# Patient Record
Sex: Female | Born: 1980 | Hispanic: Yes | Marital: Married | Smoking: Never smoker
Health system: Southern US, Community
[De-identification: ages and names within clinical notes are randomized; demographics above are authoritative.]

## PROBLEM LIST (undated history)

## (undated) DIAGNOSIS — Z789 Other specified health status: Secondary | ICD-10-CM

## (undated) HISTORY — PX: NO PAST SURGERIES: SHX2092

---

## 2012-05-24 NOTE — L&D Delivery Note (Signed)
Delivery Note At 4:49 AM a viable female was delivered via Vaginal, Spontaneous Delivery (Presentation: ; Occiput Anterior).  APGAR: 9, 9; weight pending.   Placenta status: Intact, Spontaneous.  Cord: 3-vessel, with the following complications: None.  Cord pH: n/a  Anesthesia: Local  Episiotomy: None Lacerations: 1st degree perineal Suture Repair: 3.0 vicryl rapide Est. Blood Loss (mL): 300  Mom to postpartum.  Baby to nursery-stable.  Napoleon Form 11/20/2012, 5:12 AM

## 2012-06-06 LAB — OB RESULTS CONSOLE ANTIBODY SCREEN: Antibody Screen: NEGATIVE

## 2012-06-06 LAB — OB RESULTS CONSOLE HEPATITIS B SURFACE ANTIGEN: Hepatitis B Surface Ag: NEGATIVE

## 2012-06-06 LAB — OB RESULTS CONSOLE VARICELLA ZOSTER ANTIBODY, IGG: Varicella: IMMUNE

## 2012-06-06 LAB — OB RESULTS CONSOLE GC/CHLAMYDIA: Chlamydia: NEGATIVE

## 2012-06-22 LAB — GLUCOSE, 1 HOUR: Glucose, 1 hour: 100

## 2012-09-05 NOTE — Progress Notes (Signed)
+   PPD test 15mm 07/15/12 Quad screen negative 06/26/12

## 2012-09-06 ENCOUNTER — Encounter: Payer: Self-pay | Admitting: *Deleted

## 2012-10-10 ENCOUNTER — Encounter: Payer: Self-pay | Admitting: Obstetrics & Gynecology

## 2012-10-10 ENCOUNTER — Ambulatory Visit (INDEPENDENT_AMBULATORY_CARE_PROVIDER_SITE_OTHER): Payer: Medicaid Other | Admitting: Obstetrics & Gynecology

## 2012-10-10 VITALS — BP 116/71 | Temp 98.3°F | Wt 176.2 lb

## 2012-10-10 DIAGNOSIS — O0933 Supervision of pregnancy with insufficient antenatal care, third trimester: Secondary | ICD-10-CM

## 2012-10-10 DIAGNOSIS — O0930 Supervision of pregnancy with insufficient antenatal care, unspecified trimester: Secondary | ICD-10-CM | POA: Insufficient documentation

## 2012-10-10 LAB — POCT URINALYSIS DIP (DEVICE)
Bilirubin Urine: NEGATIVE
Leukocytes, UA: NEGATIVE

## 2012-10-10 LAB — CBC
Platelets: 257 10*3/uL (ref 150–400)
RBC: 3.54 MIL/uL — ABNORMAL LOW (ref 3.87–5.11)
WBC: 8.7 10*3/uL (ref 4.0–10.5)

## 2012-10-10 NOTE — Progress Notes (Signed)
Subjective:    Tiffany Morse is being seen today for her first obstetrical visit.  This is not a planned pregnancy. She is at [redacted]w[redacted]d gestation. Her obstetrical history is significant for late transfer of care. Relationship with FOB: unknown. Patient does intend to breast feed. Pregnancy history fully reviewed.  Menstrual History: OB History   Grav Para Term Preterm Abortions TAB SAB Ect Mult Living   3 2 2       2        No LMP recorded. Patient is pregnant.    The following portions of the patient's history were reviewed and updated as appropriate: allergies, current medications, past family history, past medical history, past social history, past surgical history and problem list. History   Social History  . Marital Status: Single    Spouse Name: N/A    Number of Children: N/A  . Years of Education: N/A   Occupational History  . Not on file.   Social History Main Topics  . Smoking status: Never Smoker   . Smokeless tobacco: Not on file  . Alcohol Use: No  . Drug Use: No  . Sexually Active: Not Currently   Other Topics Concern  . Not on file   Social History Narrative  . No narrative on file    Review of Systems Pertinent items are noted in HPI.    Objective:    BP 116/71  Temp(Src) 98.3 F (36.8 C)  Wt 176 lb 3.2 oz (79.924 kg)  BMI 31.22 kg/m2  General Appearance:    Alert, cooperative, no distress, appears stated age  Head:    Normocephalic, without obvious abnormality, atraumatic           Throat:   Lips, mucosa, and tongue normal; teeth and gums normal  Neck:   Supple, symmetrical, trachea midline, no adenopathy;    thyroid:  no enlargement/tenderness/nodules; no carotid   bruit or JVD  Back:     Symmetric, no curvature, ROM normal, no CVA tenderness  Lungs:     Clear to auscultation bilaterally, respirations unlabored  Chest Wall:    No tenderness or deformity   Heart:    Regular rate and rhythm, S1 and S2 normal, no murmur, rub   or gallop    Abdomen:     Soft, non-tender, bowel sounds active all four quadrants,    no masses, no organomegaly        Extremities:   Extremities normal, atraumatic, no cyanosis or edema  Pulses:   2+ and symmetric all extremities  Skin:   Skin color, texture, turgor normal, no rashes or lesions  Lymph nodes:   Cervical, supraclavicular, and axillary nodes normal    Assessment:    Pregnancy at 33 and 0/7 weeks  Late transfer of care (records and results under media)- prev OB chart reviewed Did not have 28 week labs   Plan:     Prenatal vitamins. Problem list reviewed and updated. Follow up in 2 weeks. Needs to have Title XIX forms signed on next visit 1hour GTT, RPR, HIV and HCT today

## 2012-10-10 NOTE — Patient Instructions (Addendum)
Prueba de tolerancia a la glucosa durante el embarazo  (Glucose Tolerance Test During Pregnancy) La prueba de tolerancia a la glucosa (GTT) o la prueba de la glucosa de 3 horas se pueden utilizar para determinar si la diabetes en una mujer ha comenzado o se ha diagnosticado por primera vez durante el embarazo (diabetes gestacional ). En general, la GTT se realiza despus de tener una prueba de glucosa de 1hora con resultados que indican que posiblemente sufra diabetes gestacional.  Esta prueba dura aproximadamente 3 horas. Despus de beber la solucin de agua azucarada le harn Neomia Dear serie de anlisis de [place]. Deber Geneticist, molecular de la prueba para asegurarse de que se realiza a Acupuncturist de [place].  INFORME A SU MDICO SOBRE:   Alergias a alimentos o medicamentos.  Medicamentos que [place], incluyendo vitaminas, hierbas, gotas oftlmicas, medicamentos de venta libre y cremas.  Enfermedades o infecciones recientes. ANTES DEL PROCEDIMIENTO  El GTT es [place] prueba de [place], lo que significa que debe dejar de comer durante un cierto perodo de [place]. La prueba ser ms precisa si no come durante 8 a 12 horas antes del examen. Por esta razn, se recomienda hacer esta prueba por la maana antes de desayunar.  PROCEDIMIENTO  No coma ni beba nada, slo agua, durante la prueba. Al llegar al laboratorio, se toma una muestra de sangre para obtener el nivel de glucosa en sangre en [place]. Una vez determinado el nivel de glucosa en [place], se le dar una solucin de azcar en agua para beber. Se le pedir que espere en un rea determinada hasta el prximo examen de sangre. Los anlisis de sangre se realizan cada hora durante 3 horas. Mantngase cerca del laboratorio para que sus muestras de [place] se puedan tomar a Chief Strategy Officer. Esto es importante. Si no se toman las [address] de [place] a Chief Strategy Officer, tendr que concurrir de [employer] en otro da para Automotive engineer prueba.  DESPUS DEL PROCEDIMIENTO   Podr comer  y beber normalmente.   Consulte la fecha en que los resultados estarn disponibles. Asegrese de [employer] Corporation. Se considera que la prueba es positiva [address] de los cuatro valores del [place] de sangre son iguales o estn por arriba del nivel normal de Event organiser. Document Released: 11/09/2011 [facility] Patient Information 2013 [place], [place]. Lactancia materna  (Breastfeeding) Decidir Museum/gallery exhibitions officer es una de las mejores elecciones que puede hacer por usted y su beb. La informacin que se brinda a Psychologist, clinical dar una breve visin de los beneficios de la lactancia materna as como de las dudas ms frecuentes alrededor de ella.  LOS BENEFICIOS DE AMAMANTAR  Para el beb   La primera leche (calostro ) ayuda al mejor funcionamiento del sistema digestivo del beb.   La leche tiene anticuerpos que provienen de la madre y que ayudan a prevenir las infecciones en el beb.   El beb tiene una menor incidencia de asma, alergias y del sndrome de muerte sbita del lactante (SMSL).   Los nutrientes en la [place] materna son mejores para el beb que los preparados para lactantes y la [place] materna ayuda a un mejor desarrollo del cerebro del beb.   Los bebs amamantados tienen menos gases, clicos y estreimiento.  Para la mam   La lactancia materna favorece el desarrollo de un vnculo muy especial entre la madre y el beb.   Es ms conveniente, siempre disponible y a Landscape architect y econmico.   Consume caloras en la [place]  y la ayuda a perder el peso ganado durante el [place].   Favorece la contraccin del tero a su tamao normal, de manera ms rpida y [employer] las hemorragias luego del [place].   Las [employer] Holdings que amamantan tienen menor riesgo de Geophysical data processor de mama.  FRECUENCIA DEL AMAMANTAMIENTO   Un beb sano, nacido a trmino, puede amamantarse con tanta frecuencia como cada hora, o espaciar las comidas cada tres horas.   Observe al beb  cuando manifieste signos de hambre. Amamante a su beb si muestra signos de hambre. Esta frecuencia variar de un beb a otro.   Amamntelo tan seguido como el beb lo solicite, o cuando usted sienta la necesidad de Paramedic sus [place].   Despierte al beb si han pasado 3  4 horas desde la ltima comida.   El amamantamiento frecuente la ayudar a producir ms [place] y a Education officer, community de Engineer, mining en los pezones e hinchazn de las [place].  POSICIN DEL BEBE PARA EL AMAMANTAMIENTO   Ya sea que se encuentre [place] o sentada, asegrese que el abdomen del beb enfrente el suyo.   Sostenga la mama con el pulgar por arriba y los otros 4 dedos por debajo. Asegrese que sus dedos se encuentren lejos del pezn y de la boca del beb.   Empuje suavemente los labios del beb con el pezn o con el dedo.   Cuando la boca del beb se abra lo suficiente, introduzca el pezn y la areola tanto como le sea posible dentro de la boca.   Coloque al beb cerca suyo de modo que su nariz y mejillas toquen las mamas al [employer].  ALIMENTACIN Y SUCCIN   La duracin de cada comida vara de un beb a otro y de Neomia Dear comida a [place].   El beb debe succionar entre 2 y 3 minutos para que Development worker, international aid. Esto se denomina "bajada". Por este motivo, permita que el nio se alimente en cada mama todo lo que desee. Terminar de mamar cuando haya recibido la cantidad [place] de nutrientes.   Para detener la succin coloque su dedo en la comisura de la boca del nio y Midwife entre sus encas antes de quitarle la mama de la boca. Esto la ayudar a English as a second language teacher.  COMO SABER SI EL BEB OBTIENE LA SUFICIENTE LECHE MATERNA  Preguntarse si el beb obtiene la cantidad suficiente de [place] es una preocupacin frecuente [employer]. Puede asegurarse que el beb tiene la leche suficiente si:   El beb succiona activamente y usted escucha que traga .   El beb parece estar relajado y satisfecho despus  de Psychologist, clinical.   El nio se alimenta al menos 8 a 12 veces en 24 horas. Alimntelo hasta que se desprenda por sus propios medios o se quede dormido en la primera mama (al menos durante 10 a 20 minutos), luego ofrzcale el otro lado.   El beb moja 5 a 6 paales desechables (6 a 8 paales de tela) en 24 horas cuando tiene 5  6 das de vida.   Tiene al Lowe's Companies 3 a 4 deposiciones todos los B[employer]on[employer] and Company primeros meses. La materia fecal debe ser blanda y [place].   El beb debe aumentar 4 a 6 libras (120 a 170 gr.) por semana despus de los 4 [address] de vida.   Siente que las mamas se ablandan despus de amamantar  REDUCIR LA CONGESTIN DE LAS MAMAS   Durante la primera semana despus del [place],  usted puede experimentar hinchazn en las mamas. Cuando las mamas estn congestionadas, se sienten calientes, llenas y molestas al tacto. Puede reducir la congestin si:   Lo amamanta frecuentemente, cada 2-3 horas. Las mams que [employer] pronto y con frecuencia tienen menos problemas de [place].   Coloque compresas de hielo en sus mamas durante 10-20 minutos entre cada amamantamiento. Esto ayuda a Building services engineer. Envuelva las bolsas de hielo en una toalla liviana para proteger su piel. Las bolsas de vegetales congelados funcionan bien para este propsito.   Tome una ducha tibia o aplique compresas hmedas calientes en las mamas durante 5 a 10 minutos antes de cada vez que [place]. Esto aumenta la circulacin y [place] a que la [place].   Masajee suavemente la mama antes y Psychologist, sport and exercise. Con las puntas de los dedos, masajee desde la pared torcica hacia abajo hasta llegar al pezn, con movimientos circulares.   Asegrese que el nio vaca al menos una mama antes de cambiar de lado.   Use un sacaleche para vaciar la mama si el beb se duerme o no se alimenta bien. Tambin podr Phelps Dodge con esa bomba si tiene que volver al trabajo o siente que las mamas estn  congestionadas.   Evite los biberones, chupetes o complementar la alimentacin con agua o jugos en lugar de la [place]. La leche materna es todo el alimento que el beb necesita. No es necesario que el nio ingiera agua o preparados de bibern. Louann Liv, es lo mejor para ayudar a que las mamas produzcan ms [place]. no darle suplementos al Bank of America las primeras semanas.   Verifique que el beb se encuentra en la posicin correcta mientras lo alimenta.   Use un sostn que soporte bien sus mamas y State Street Corporation que tienen aro.   Consuma una dieta balanceada y beba lquidos en cantidad.   Descanse con frecuencia, reljese y tome sus vitaminas prenatales para evitar la fatiga, el estrs y la anemia.  Si sigue estas indicaciones, la congestin debe mejorar en 24 a 48 horas. Si an tiene dificultades, consulte a Barista.  CUDESE USTED MISMA  Cuide sus mamas.   Bese o dchese diariamente.   Evite usar Eaton Corporation.   Comience a amamantar del lado izquierdo en una comida y del lado derecho en la siguiente.   Notar que aumenta el flujo de [place] a los 2 a 5 [address] despus del [address]. Puede sentir algunas molestias por la congestin, lo que hace que sus mamas estn duras y sensibles. La congestin disminuye en 24 a 48 horas. Mientras tanto, aplique toallas hmedas calientes durante 5 a 10 minutos antes de amamantar. Un masaje suave y la extraccin de un poco de leche antes de Museum/gallery exhibitions officer ablandarn las mamas y har ms fcil que el beb se agarre.   Use un buen sostn y seque al aire los pezones durante 3 a 4 minutos luego de Wellsite geologist.   Solo utilice apsitos de algodn.   Utilice lanolina [employer] pezones luego de [place]. No necesita lavarlos luego de alimentar al [employer]. Otra opcin es exprimir algunas gotas de [place] y Pep[employer] Holdings pezones.  Cumpla con estos cuidados   Consuma alimentos bien balanceados y refrigerios nutritivos.    Dixie Dials, jugos de fruta y agua para Warehouse manager sed (alrededor de 8 vasos por Futures trader).   Descanse lo suficiente.  Evite los alimentos que usted note que pueden afectar al beb.  SOLICITE ATENCIN MDICA SI:   Tiene dificultad con la lactancia materna y [place].   Tiene una zona de color rojo, dura y dolorosa en la mama que se acompaa de [place].   El beb est muy somnoliento como para alimentarse bien o tiene problemas para dormir.   Su beb moja menos de 6 paales al [address], a los 5 [address] de vida.   La piel del beb o la parte blanca de sus ojos est ms amarilla de lo que estaba en el hospital.   Se siente deprimida.  Document Released: 05/10/2005 Document Revised: 11/09/2011 [facility] Patient Information 2013 [place], [place]. Informacin sobre Engineer, civil (consulting)  (Sterilization Information, Female) La esterilizacin en la mujer es un procedimiento que se realiza para Location manager de [place]. Hay diferentes formas de Futures trader, Biomedical engineer en todos los casos se bloquean o se cierran las trompas de [place] para que vulos no puedan llegar al tero. Si el vulo no llega al tero, los espermatozoides no fertilizan el vulo, y usted no podr [place].  La esterilizacin se lleva a cabo por medio de un procedimiento quirrgico. A veces, estos procedimientos se realizan en el hospital haciendo dormir a Education officer, community. En otros casos se realiza en un consultorio en una clnica y la paciente permanece despierta. Las trompas de Nordstrom se pueden cortar, Public affairs consultant o sellar quirrgicamente, con un procedimiento que se llama ligadura de trompas. Otro mtodo es cerrarlas con clips o anillos. La esterilizacin tambin puede realizarse mediante la colocacin de un pequeo resorte en cada trompa de [place], lo que hace que se desarrolle tejido cicatrizal en el interior de la trompa. Luego el tejido Verizon.  Comente el tema  con su mdico para responder las inquietudes que usted o su pareja puedan Warehouse manager. Podr preguntarle a su mdico qu tipo de Diplomatic Services operational officer. Algunos profesionales pueden no realizar todas las opciones. La esterilizacin es permanente y slo debe hacerse si est segura de que no desea tener hijos o no desea tener ms hijos. La reversin de la esterilizacin puede no tener xito.  PROCEDIMIENTOS PARA LA ESTERILIZACIN   Esterilizacin laparoscpica. Es un mtodo quirrgico que se Biomedical engineer en otro momento que no es inmediatamente despus del [place]. Se realizan dos incisiones en la zona baja del abdomen. Un tubo delgado con una fuente de luz (laparoscopio) se inserta en una de las incisiones y se [place] para Surveyor, quantity. Las trompas de [place] se cierran con un anillo o un clip. Podrn utilizar un instrumento que utiliza el calor para sellar las trompas (electrocauterizacin)..   Mini-laparotoma. Es un mtodo quirrgico que se Biomedical engineer 1 o 2 das despus del [place]. En general, consiste en una pequea incisin que se hace justo debajo del (ombligo) por el cual se visualizan las trompas de [place]. Las trompas pueden ser selladas, atadas o cortadas.   Esterilizacin histeroscpica. Esto se realiza en otro momento que no sea inmediatamente despus del parto. Se coloca un pequeo resorte helicoidal a travs del cuello y del cuerpo del tero y se inserta en las trompas de [place]. El resorte produce cicatrizacin y [employer]ent [employer] trompas. Se deben utilizar otras formas de anticoncepcin durante 3 meses despus del procedimiento para permitir que el tejido cicatrizal se forme completamente. Adems, es necesario hacer una histerosalpingografa despus de 3 meses para asegurarse de que el procedimiento ha sido exitoso.  La histerosalpingografa es un procedimiento en el que utilizan rayos X para observar el  tero y las trompas de [place] despus de insertar un dispositivo, para asegurarse de que  est bien colocado. LA ESTERILIZACIN ES UN PROCEDIMIENTO SEGURO?  La esterilizacin se considera un procedimiento seguro en el que rara vez se producen complicaciones. Los riesgos dependen del tipo de procedimiento que se realicen. Al igual que con cualquier procedimiento quirrgico, puede haber riesgos. Algunos son:   Heron Nay.  Infeccin.  Reaccin a la anestesia.  Lesin en los rganos circundantes. Los riesgos especficos de la colocacin de los resortes por histeroscopa son:   No se Production designer, theatre/television/film.   Las resortes pueden salirse del Environmental consultant.   Las trompas no quedan completamente bloqueadas despus de 3 meses.   Ocurre una lesin en los rganos adyacentes al colocarlo.  LA ESTERILIZACIN ES UN MTODO EFECTIVO?  La esterilizacin es efectiva en casi 100% , pero puede fallar. Segn el tipo de esterilizacin, el porcentaje de fracaso puede ser tan alto como en un 3%. Despus de la esterilizacin histeroscpica con colocacin de un resorte en las trompas de [place], Pension scheme manager un mtodo anticonceptivo de respaldo durante 3 meses. La esterilizacin es efectiva durante toda la vida.  LOS BENEFICIOS DE LA ESTERILIZACIN   No afecta las hormonas, y por lo tanto no afectar sus perodos menstruales, el deseo o el rendimiento sexual, .   Es efectiva para toda la vida.   Es segura.   No tiene que preocuparse por Location manager. Tenga en cuenta que si fue sometida a este procedimiento, debe esperar 3 meses (o hasta que el mdico lo confirme) antes de considerar que no quedar embarazada.   No hay efectos secundarios a diferencia de otros tipos de control de la natalidad (contracepcin).  INCONVENIENTES DE LA ESTERILIZACIN   Usted debe estar seguro de que no desea tener hijos o ms hijos. El procedimiento es [place].   No ofrece proteccin contra las infecciones de transmisin sexual (ITS).   Las trompas Hess Corporation a Engineer, building services. Si  esto ocurre, habr riesgo de embarazo. Tambin hay un mayor riesgo (50%) que el embarazo sea ectpico. Se llama as al embarazo que ocurre fuera del tero. Document Released: 10/27/2007 Document Revised: 11/09/2011 [facility] Patient Information 2013 [place], [place].

## 2012-10-11 ENCOUNTER — Telehealth: Payer: Self-pay | Admitting: *Deleted

## 2012-10-11 ENCOUNTER — Encounter: Payer: Self-pay | Admitting: Obstetrics & Gynecology

## 2012-10-11 LAB — GLUCOSE TOLERANCE, 1 HOUR (50G) W/O FASTING: Glucose, 1 Hour GTT: 155 mg/dL — ABNORMAL HIGH (ref 70–140)

## 2012-10-11 LAB — RPR

## 2012-10-11 LAB — HIV ANTIBODY (ROUTINE TESTING W REFLEX): HIV: NONREACTIVE

## 2012-10-11 NOTE — Telephone Encounter (Signed)
Message copied by Mannie Stabile on Wed Oct 11, 2012 11:57 AM ------      Message from: Willodean Rosenthal      Created: Wed Oct 11, 2012 10:17 AM       Please call pt.  Abnormal 1hour GTT 155.  Needs 3hour GCT.            Thx,      clh-S  ------

## 2012-10-11 NOTE — Telephone Encounter (Signed)
Message copied by Mannie Stabile on Wed Oct 11, 2012 11:57 AM ------      Message from: Willodean Rosenthal      Created: Wed Oct 11, 2012 10:18 AM       Pt also has anemia in pregnancy.            Please ask her ot take FeSO4 bid (OTC)            Will need translator            clh-S            P.s.      Please see prior note re abnormal 1hour GTT ------

## 2012-10-11 NOTE — Telephone Encounter (Signed)
Called patient with interpreter left message for her to call us back.

## 2012-10-18 ENCOUNTER — Encounter: Payer: Self-pay | Admitting: *Deleted

## 2012-10-18 NOTE — Telephone Encounter (Signed)
Letter sent to patient. Will inform her of anemia at next visit.

## 2012-10-18 NOTE — Telephone Encounter (Signed)
Called patient with interpreter, left message to call us back.

## 2012-10-19 ENCOUNTER — Telehealth: Payer: Self-pay | Admitting: General Practice

## 2012-10-19 NOTE — Telephone Encounter (Signed)
Patient called and left message stating she received a phone call from [place] yesterday. Called patient back, no answer- left message with [facility] for interpreter stating that we are returning her phone call and have been trying to contact her and have been unable to reach her so we sent a letter in the mail containing the information/results and to call us back if she has any questions

## 2012-10-24 ENCOUNTER — Ambulatory Visit (INDEPENDENT_AMBULATORY_CARE_PROVIDER_SITE_OTHER): Payer: Medicaid Other | Admitting: Obstetrics & Gynecology

## 2012-10-24 VITALS — BP 115/73 | Temp 98.5°F | Wt 179.2 lb

## 2012-10-24 DIAGNOSIS — O0933 Supervision of pregnancy with insufficient antenatal care, third trimester: Secondary | ICD-10-CM

## 2012-10-24 DIAGNOSIS — O0930 Supervision of pregnancy with insufficient antenatal care, unspecified trimester: Secondary | ICD-10-CM

## 2012-10-24 LAB — POCT URINALYSIS DIP (DEVICE)
Bilirubin Urine: NEGATIVE
Ketones, ur: NEGATIVE mg/dL
Leukocytes, UA: NEGATIVE
Protein, ur: NEGATIVE mg/dL

## 2012-10-24 NOTE — Progress Notes (Signed)
No complaints. Will sign BTL consent today.

## 2012-10-24 NOTE — Patient Instructions (Signed)
Ligadura de trompas en el postparto   (Postpartum Tubal Ligation)  La ligadura de trompas en el postparto es un procedimiento que obstruye las trompas inmediatamente después del nacimiento o a los 1 ó 2 días después del nacimiento. Se realiza antes de que el útero vuelva a su ubicación normal. El procedimiento también se llama minilaparotomía. Al cerrar las trompas de Falopio, los óvulos que se liberan de los ovarios no pueden entrar en el útero y los espermatozoides no pueden llegar al óvulo. La ligadura de trompas significa que no podrá quedar embarazada o tener un bebé.   Aunque en algunos casos pueda revertirse, debería considerarse como permanente e irreversible. Si desea quedar embarazada en el futuro, no debe realizarse este procedimiento.  INFORME A SU MÉDICO SOBRE:   · Alergias a alimentos o medicamentos.  · Medicamentos que utiliza, incluyendo vitaminas, hierbas, gotas oftálmicas, medicamentos de venta libre y cremas.  · Uso de corticoides (por vía oral o cremas).  · Problemas anteriores debido a anestésicos.  · Antecedentes de hemorragias o coágulos sanguíneos.  · Resfríos o infecciones recientes.  · Cirugías anteriores.  · Otros problemas de salud, incluyendo diabetes y problemas renales.  RIESGOS Y COMPLICACIONES   · Infecciones.  · Sangrado.  · Lesiones en otros órganos.  · Efectos secundarios de la anestesia.  · Fallas en el procedimiento.  · Embarazo ectópico.  · Arrepentimiento por haber realizado el procedimiento.  ANTES DEL PROCEDIMIENTO   · Es posible que tenga que firmar ciertos formularios de autorización con el seguro hasta 30 días antes de la fecha.  · Si el procedimiento se lleva a cabo el mismo día, después del parto no podrá comer ni beber nada. Si le hacen el procedimiento un día después del parto, podrá comer y beber hasta la medianoche. Su médico le dará instrucciones específicas en función de su situación.  PROCEDIMIENTO   · Si se realiza entre 1 y 2 días después del parto.  · Durante  el procedimiento le administrarán un medicamento que la hará dormir (anestesia general).  · Le colocarán un tubo por la garganta para ayudarla a respirar mientras está bajo los efectos de la anestesia general.  · Le harán un corte (incisión) en la zona del ombligo  · Las trompas de Falopio se buscan a través de la incisión.  · Luego se sellan, se atan o se cortan.  · Si tuvo un parto por cesárea  · La ligadura de trompas se realiza a través de la incisión (después del nacimiento del bebé).  · Una vez que las trompas están bloqueadas, la incisión se cierra con puntos (suturas). Le colocarán una venda sobre las incisiones.  DESPUÉS DEL PROCEDIMIENTO   · Es posible que sienta algún dolor o cólicos en la zona abdominal durante los 3 a 7 días siguientes.  · Le administrarán analgésicos para aliviar cualquier molestia.  · También podrá sentir malestar en el estómago si le dieron anestesia general.  · Podrá sentir una leve molestia en la garganta. Se debe al tubo que le colocaron en la garganta mientras dormía.  · Podrá sentirse cansada y tendrá que hacer reposo el resto del día.  Document Released: 02/17/2005 Document Revised: 11/09/2011  ExitCare® Patient Information ©2014 [employer].

## 2012-10-24 NOTE — Progress Notes (Signed)
Pulse- 94 Patient reports mild lower back pain and occasional contractions

## 2012-10-27 ENCOUNTER — Other Ambulatory Visit: Payer: Medicaid Other

## 2012-10-27 DIAGNOSIS — R7309 Other abnormal glucose: Secondary | ICD-10-CM

## 2012-10-28 LAB — GLUCOSE TOLERANCE, 3 HOURS
Glucose Tolerance, 2 hour: 136 mg/dL (ref 70–164)
Glucose Tolerance, Fasting: 76 mg/dL (ref 70–104)
Glucose, GTT - 3 Hour: 131 mg/dL (ref 70–144)

## 2012-10-29 ENCOUNTER — Encounter: Payer: Self-pay | Admitting: Obstetrics & Gynecology

## 2012-10-30 ENCOUNTER — Encounter: Payer: Medicaid Other | Admitting: Obstetrics & Gynecology

## 2012-11-08 ENCOUNTER — Encounter: Payer: Self-pay | Admitting: Advanced Practice Midwife

## 2012-11-08 ENCOUNTER — Ambulatory Visit (HOSPITAL_COMMUNITY)
Admission: RE | Admit: 2012-11-08 | Discharge: 2012-11-08 | Disposition: A | Discharge status: Home / Self Care | Payer: Medicaid Other | Source: Ambulatory Visit | Attending: Advanced Practice Midwife | Admitting: Advanced Practice Midwife

## 2012-11-08 ENCOUNTER — Ambulatory Visit (INDEPENDENT_AMBULATORY_CARE_PROVIDER_SITE_OTHER): Payer: Medicaid Other | Admitting: Advanced Practice Midwife

## 2012-11-08 VITALS — BP 117/70 | Temp 97.7°F | Wt 182.8 lb

## 2012-11-08 DIAGNOSIS — N898 Other specified noninflammatory disorders of vagina: Secondary | ICD-10-CM | POA: Insufficient documentation

## 2012-11-08 DIAGNOSIS — O0930 Supervision of pregnancy with insufficient antenatal care, unspecified trimester: Secondary | ICD-10-CM

## 2012-11-08 DIAGNOSIS — L293 Anogenital pruritus, unspecified: Secondary | ICD-10-CM

## 2012-11-08 DIAGNOSIS — O98019 Tuberculosis complicating pregnancy, unspecified trimester: Secondary | ICD-10-CM

## 2012-11-08 DIAGNOSIS — R7611 Nonspecific reaction to tuberculin skin test without active tuberculosis: Secondary | ICD-10-CM | POA: Insufficient documentation

## 2012-11-08 DIAGNOSIS — O98013 Tuberculosis complicating pregnancy, third trimester: Secondary | ICD-10-CM

## 2012-11-08 DIAGNOSIS — IMO0001 Reserved for inherently not codable concepts without codable children: Secondary | ICD-10-CM | POA: Insufficient documentation

## 2012-11-08 LAB — POCT URINALYSIS DIP (DEVICE)
Bilirubin Urine: NEGATIVE
Hgb urine dipstick: NEGATIVE
Nitrite: NEGATIVE
Specific Gravity, Urine: 1.02 (ref 1.005–1.030)
pH: 6.5 (ref 5.0–8.0)

## 2012-11-08 MED ORDER — TERCONAZOLE 0.4 % VA CREA: 1.0000 | TOPICAL_CREAM | Freq: Every day | VAGINAL | Status: DC

## 2012-11-08 NOTE — Progress Notes (Signed)
Pulse- 103  Pain-lower back  Pressure-vaginal

## 2012-11-08 NOTE — Patient Instructions (Signed)
Vaginal Delivery  Your caregiver must first be sure you are in labor. Signs of labor include:   You may pass what is called "the mucus plug" before labor begins. This is a small amount of blood stained mucus.   Regular uterine contractions.   The time between contractions get closer together.   The discomfort and pain gradually gets more intense.   Pains are mostly located in the back.   Pains get worse when walking.   The cervix (the opening of the uterus) becomes thinner (begins to efface) and opens up (dilates).  Once you are in labor and admitted into the hospital or care center, your caregiver will do the following:   A complete physical examination.   Check your vital signs (blood pressure, pulse, temperature and the fetal heart rate).   Do a vaginal examination (using a sterile glove and lubricant) to determine:   The position (presentation) of the baby (head [vertex] or buttock first).   The level (station) of the baby's head in the birth canal.   The effacement and dilatation of the cervix.   You may have your pubic hair shaved and be given an enema depending on your caregiver and the circumstance.   An electronic monitor is usually placed on your abdomen. The monitor follows the length and intensity of the contractions, as well as the baby's heart rate.   Usually, your caregiver will insert an IV in your arm with a bottle of sugar water. This is done as a precaution so that medications can be given to you quickly during labor or delivery.  NORMAL LABOR AND DELIVERY IS DIVIDED UP INTO 3 STAGES:  First Stage  This is when regular contractions begin and the cervix begins to efface and dilate. This stage can last from 3 to 15 hours. The end of the first stage is when the cervix is 100% effaced and 10 centimeters dilated. Pain medications may be given by    Injection (morphine, demerol, etc.)    Regional anesthesia (spinal, caudal or epidural, anesthetics given in different locations of the spine). Paracervical pain medication may be given, which is an injection of and anesthetic on each side of the cervix.  A pregnant woman may request to have "Natural Childbirth" which is not to have any medications or anesthesia during her labor and delivery.  Second Stage  This is when the baby comes down through the birth canal (vagina) and is born. This can take 1 to 4 hours. As the baby's head comes down through the birth canal, you may feel like you are going to have a bowel movement. You will get the urge to bear down and push until the baby is delivered. As the baby's head is being delivered, the caregiver will decide if an episiotomy (a cut in the perineum and vagina area) is needed to prevent tearing of the tissue in this area. The episiotomy is sewn up after the delivery of the baby and placenta. Sometimes a mask with nitrous oxide is given for the mother to breath during the delivery of the baby to help if there is too much pain. The end of Stage 2 is when the baby is fully delivered. Then when the umbilical cord stops pulsating it is clamped and cut.  Third Stage  The third stage begins after the baby is completely delivered and ends after the placenta (afterbirth) is delivered. This usually takes 5 to 30 minutes. After the placenta is delivered, a medication is given   either by intravenous or injection to help contract the uterus and prevent bleeding. The third stage is not painful and pain medication is usually not necessary. If an episiotomy was done, it is repaired at this time.  After the delivery, the mother is watched and monitored closely for 1 to 2 hours to make sure there is no postpartum bleeding (hemorrhage). If there is a lot of bleeding, medication is given to contract the uterus and stop the bleeding.  Document Released: 02/17/2008 Document Revised: 02/02/2012 Document Reviewed: 02/17/2008   ExitCare Patient Information 2014 [employer].

## 2012-11-08 NOTE — Progress Notes (Signed)
Doing well. Noted to have a + PPD in February in [place] But never had CXR (was to be done after 20 weeks and pt moved).  Will schedule for this week. Also c/o vag.itching. Will Rx Terazol cream.

## 2012-11-11 LAB — CULTURE, BETA STREP (GROUP B ONLY)

## 2012-11-14 ENCOUNTER — Encounter: Payer: Medicaid Other | Admitting: Obstetrics & Gynecology

## 2012-11-20 ENCOUNTER — Encounter (HOSPITAL_COMMUNITY): Payer: Self-pay

## 2012-11-20 ENCOUNTER — Inpatient Hospital Stay (HOSPITAL_COMMUNITY)
Admission: AD | Admit: 2012-11-20 | Discharge: 2012-11-21 | DRG: 775 | Disposition: A | Discharge status: Home / Self Care | Payer: Medicaid Other | Source: Ambulatory Visit | Attending: Obstetrics & Gynecology | Admitting: Obstetrics & Gynecology

## 2012-11-20 DIAGNOSIS — IMO0001 Reserved for inherently not codable concepts without codable children: Secondary | ICD-10-CM

## 2012-11-20 DIAGNOSIS — O0930 Supervision of pregnancy with insufficient antenatal care, unspecified trimester: Secondary | ICD-10-CM

## 2012-11-20 HISTORY — DX: Other specified health status: Z78.9

## 2012-11-20 LAB — TYPE AND SCREEN: Antibody Screen: NEGATIVE

## 2012-11-20 LAB — CBC
HCT: 30.9 % — ABNORMAL LOW (ref 36.0–46.0)
Hemoglobin: 10.1 g/dL — ABNORMAL LOW (ref 12.0–15.0)
MCH: 26 pg (ref 26.0–34.0)
MCHC: 32.7 g/dL (ref 30.0–36.0)
RBC: 3.88 MIL/uL (ref 3.87–5.11)

## 2012-11-20 LAB — RPR: RPR Ser Ql: NONREACTIVE

## 2012-11-20 MED ORDER — LIDOCAINE HCL (PF) 1 % IJ SOLN
INTRAMUSCULAR | Status: AC
  Filled 2012-11-20: qty 30

## 2012-11-20 MED ORDER — MISOPROSTOL 200 MCG PO TABS
ORAL_TABLET | ORAL | Status: AC
  Filled 2012-11-20: qty 4

## 2012-11-20 MED ORDER — SIMETHICONE 80 MG PO CHEW: 80.0000 mg | CHEWABLE_TABLET | ORAL | Status: DC | PRN

## 2012-11-20 MED ORDER — BENZOCAINE-MENTHOL 20-0.5 % EX AERO
1.0000 "application " | INHALATION_SPRAY | CUTANEOUS | Status: DC | PRN
  Administered 2012-11-20: 1 via TOPICAL
  Filled 2012-11-20: qty 56

## 2012-11-20 MED ORDER — IBUPROFEN 600 MG PO TABS
600.0000 mg | ORAL_TABLET | Freq: Four times a day (QID) | ORAL | Status: DC | PRN
  Administered 2012-11-20: 600 mg via ORAL
  Filled 2012-11-20: qty 1

## 2012-11-20 MED ORDER — FERROUS SULFATE 325 (65 FE) MG PO TABS
325.0000 mg | ORAL_TABLET | Freq: Two times a day (BID) | ORAL | Status: DC
  Administered 2012-11-20 – 2012-11-21 (×2): 325 mg via ORAL
  Filled 2012-11-20 (×2): qty 1

## 2012-11-20 MED ORDER — OXYCODONE-ACETAMINOPHEN 5-325 MG PO TABS
1.0000 | ORAL_TABLET | ORAL | Status: DC | PRN
  Administered 2012-11-20: 1 via ORAL
  Filled 2012-11-20: qty 1

## 2012-11-20 MED ORDER — SENNOSIDES-DOCUSATE SODIUM 8.6-50 MG PO TABS
2.0000 | ORAL_TABLET | Freq: Every day | ORAL | Status: DC
  Administered 2012-11-20: 2 via ORAL

## 2012-11-20 MED ORDER — ONDANSETRON HCL 4 MG/2ML IJ SOLN: 4.0000 mg | INTRAMUSCULAR | Status: DC | PRN

## 2012-11-20 MED ORDER — ACETAMINOPHEN 325 MG PO TABS: 650.0000 mg | ORAL_TABLET | ORAL | Status: DC | PRN

## 2012-11-20 MED ORDER — ONDANSETRON HCL 4 MG/2ML IJ SOLN: 4.0000 mg | Freq: Four times a day (QID) | INTRAMUSCULAR | Status: DC | PRN

## 2012-11-20 MED ORDER — BISACODYL 10 MG RE SUPP: 10.0000 mg | Freq: Every day | RECTAL | Status: DC | PRN

## 2012-11-20 MED ORDER — LIDOCAINE HCL (PF) 1 % IJ SOLN
30.0000 mL | INTRAMUSCULAR | Status: DC | PRN
  Administered 2012-11-20: 30 mL via SUBCUTANEOUS
  Filled 2012-11-20: qty 30

## 2012-11-20 MED ORDER — MEASLES, MUMPS & RUBELLA VAC ~~LOC~~ INJ
0.5000 mL | INJECTION | Freq: Once | SUBCUTANEOUS | Status: DC
  Filled 2012-11-20: qty 0.5

## 2012-11-20 MED ORDER — LACTATED RINGERS IV SOLN: 500.0000 mL | INTRAVENOUS | Status: DC | PRN

## 2012-11-20 MED ORDER — OXYCODONE-ACETAMINOPHEN 5-325 MG PO TABS
1.0000 | ORAL_TABLET | ORAL | Status: DC | PRN
  Administered 2012-11-20: 2 via ORAL
  Administered 2012-11-21: 1 via ORAL
  Filled 2012-11-20: qty 2
  Filled 2012-11-20: qty 1

## 2012-11-20 MED ORDER — PRENATAL MULTIVITAMIN CH
1.0000 | ORAL_TABLET | Freq: Every day | ORAL | Status: DC
  Administered 2012-11-20 – 2012-11-21 (×2): 1 via ORAL
  Filled 2012-11-20 (×2): qty 1

## 2012-11-20 MED ORDER — OXYTOCIN 40 UNITS IN LACTATED RINGERS INFUSION - SIMPLE MED
INTRAVENOUS | Status: AC
  Filled 2012-11-20: qty 1000

## 2012-11-20 MED ORDER — LANOLIN HYDROUS EX OINT: TOPICAL_OINTMENT | CUTANEOUS | Status: DC | PRN

## 2012-11-20 MED ORDER — ONDANSETRON HCL 4 MG PO TABS: 4.0000 mg | ORAL_TABLET | ORAL | Status: DC | PRN

## 2012-11-20 MED ORDER — TETANUS-DIPHTH-ACELL PERTUSSIS 5-2.5-18.5 LF-MCG/0.5 IM SUSP: 0.5000 mL | Freq: Once | INTRAMUSCULAR | Status: DC

## 2012-11-20 MED ORDER — CITRIC ACID-SODIUM CITRATE 334-500 MG/5ML PO SOLN: 30.0000 mL | ORAL | Status: DC | PRN

## 2012-11-20 MED ORDER — OXYTOCIN 40 UNITS IN LACTATED RINGERS INFUSION - SIMPLE MED
62.5000 mL/h | INTRAVENOUS | Status: DC
  Administered 2012-11-20: 62.5 mL/h via INTRAVENOUS

## 2012-11-20 MED ORDER — DIBUCAINE 1 % RE OINT: 1.0000 "application " | TOPICAL_OINTMENT | RECTAL | Status: DC | PRN

## 2012-11-20 MED ORDER — WITCH HAZEL-GLYCERIN EX PADS: 1.0000 "application " | MEDICATED_PAD | CUTANEOUS | Status: DC | PRN

## 2012-11-20 MED ORDER — IBUPROFEN 600 MG PO TABS
600.0000 mg | ORAL_TABLET | Freq: Four times a day (QID) | ORAL | Status: DC
  Administered 2012-11-20 – 2012-11-21 (×5): 600 mg via ORAL
  Filled 2012-11-20 (×5): qty 1

## 2012-11-20 MED ORDER — FLEET ENEMA 7-19 GM/118ML RE ENEM: 1.0000 | ENEMA | Freq: Every day | RECTAL | Status: DC | PRN

## 2012-11-20 MED ORDER — ZOLPIDEM TARTRATE 5 MG PO TABS: 5.0000 mg | ORAL_TABLET | Freq: Every evening | ORAL | Status: DC | PRN

## 2012-11-20 MED ORDER — NALBUPHINE SYRINGE 5 MG/0.5 ML
10.0000 mg | INJECTION | INTRAMUSCULAR | Status: DC | PRN
  Filled 2012-11-20: qty 1

## 2012-11-20 MED ORDER — OXYTOCIN 40 UNITS IN LACTATED RINGERS INFUSION - SIMPLE MED: 62.5000 mL/h | INTRAVENOUS | Status: DC | PRN

## 2012-11-20 MED ORDER — LACTATED RINGERS IV SOLN
INTRAVENOUS | Status: DC
  Administered 2012-11-20: 04:00:00 via INTRAVENOUS

## 2012-11-20 MED ORDER — DIPHENHYDRAMINE HCL 25 MG PO CAPS: 25.0000 mg | ORAL_CAPSULE | Freq: Four times a day (QID) | ORAL | Status: DC | PRN

## 2012-11-20 MED ORDER — OXYTOCIN BOLUS FROM INFUSION: 500.0000 mL | INTRAVENOUS | Status: DC

## 2012-11-20 NOTE — MAU Note (Signed)
Denies leaking of fluid or vaginal bleeding. Contractions started 2 hours ago, every 5 minutes. Positive fetal movement.

## 2012-11-20 NOTE — Progress Notes (Signed)
UR chart review completed.  

## 2012-11-20 NOTE — H&P (Signed)
Tiffany Morse is a 32 y.o. female presenting for contractions starting about 9 pm. Denies bleeding or loss of fluid. Baby moving well.  Receives prenatal care at [facility], low risk clinic, transferred care from [place]. No complications with this or prior pregnancies.   Maternal Medical History:  Reason for admission: Contractions.   Contractions: Onset was 6-12 hours ago.    Fetal activity: Perceived fetal activity is normal.   Last perceived fetal movement was within the past hour.    Prenatal complications: no prenatal complications Prenatal Complications - Diabetes: none.    OB History   Grav Para Term Preterm Abortions TAB SAB Ect Mult Living   3 2 2       2      Past Medical History  Diagnosis Date  . Medical history non-contributory    Past Surgical History  Procedure Laterality Date  . No past surgeries     Family History: family history is not on file. Social History:  reports that she has never smoked. She does not have any smokeless tobacco history on file. She reports that she does not drink alcohol or use illicit drugs.   Prenatal Transfer Tool  Maternal Diabetes: No Genetic Screening: Normal Maternal Ultrasounds/Referrals: Normal Fetal Ultrasounds or other Referrals:  None Maternal Substance Abuse:  No Significant Maternal Medications:  None Significant Maternal Lab Results:  Lab values include: Group B Strep negative Other Comments:  1 hour GTT 155, 3 hour normal  ROS  See HPI  Dilation: 5 Effacement (%): 80 Station: -2 Exam by:: Lucy Chris RNC Blood pressure 118/72, pulse 105, temperature 98.5 F (36.9 C), temperature source Oral, resp. rate 20, height 5' 2.5" (1.588 m), weight 83.19 kg (183 lb 6.4 oz).  Maternal Exam:  Uterine Assessment: Contraction strength is firm.  Contraction frequency is regular.   Abdomen: Fetal presentation: vertex  Pelvis: adequate for delivery.   Cervix: Cervix evaluated by digital exam.     Fetal  Exam Fetal Monitor Review: Mode: ultrasound.   Baseline rate: 135.  Variability: moderate (6-25 bpm).   Pattern: accelerations present and early decelerations.       Physical Exam  Constitutional: She is oriented to person, place, and time. She appears well-developed and well-nourished. No distress.  HENT:  Head: Normocephalic and atraumatic.  Eyes: Conjunctivae and EOM are normal.  Neck: Normal range of motion. Neck supple.  Cardiovascular: Normal rate.   Respiratory: Effort normal. No respiratory distress.  GI: There is no rebound and no guarding.  Musculoskeletal: Normal range of motion. She exhibits no edema and no tenderness.  Neurological: She is alert and oriented to person, place, and time.  Skin: Skin is warm and dry.    GEN:  WNWD, no distress HEENT:  NCAT, EOMI, conjunctiva clear NECK:  Supple, non-tender, no thyromegaly, trachea midline CV: RRR, no murmur RESP:  CTAB ABD:  Soft, non-tender, no guarding or rebound, normal bowel sounds EXTREM:  Warm, well perfused, no edema or tenderness NEURO:  Alert, oriented, no focal deficits GU:  Deferred (GC/Chlamydia, wet prep, pelvic exam done in MAU recently)  Dilation: 5 Effacement (%): 80 Station: -2 Presentation: Vertex Exam by:: Lucy Chris RNC'  Prenatal labs: ABO, Rh: A/Positive/-- (01/14 0000) Antibody: Negative (01/14 0000) Rubella: Immune (01/14 0000) RPR: NON REAC (05/20 1232)  HBsAg: Negative (01/14 0000)  HIV: NON REACTIVE (05/20 1232)  GBS: Negative (06/30 0000)   Assessment/Plan: 32 y.o. G3P2002 at [redacted]w[redacted]d with SOL - Admit to L&D - GBS negative -  Does not want epidural - IV pain medication as needed - Anticipate SVD  Napoleon Form 11/20/2012, 4:11 AM

## 2012-11-20 NOTE — Progress Notes (Signed)
Stopped by to check on patient. 

## 2012-11-21 ENCOUNTER — Encounter: Payer: Medicaid Other | Admitting: Obstetrics & Gynecology

## 2012-11-21 MED ORDER — PRENATAL PLUS 27-1 MG PO TABS: 1.0000 | ORAL_TABLET | Freq: Every day | ORAL | Status: DC

## 2012-11-21 MED ORDER — IBUPROFEN 600 MG PO TABS: 600.0000 mg | ORAL_TABLET | Freq: Four times a day (QID) | ORAL | Status: DC | PRN

## 2012-11-21 NOTE — Discharge Summary (Signed)
Obstetric Discharge Summary Reason for Admission: onset of labor at [redacted]w[redacted]d Prenatal Procedures: NST and ultrasound Intrapartum Procedures: spontaneous vaginal delivery Postpartum Procedures: none Complications-Operative and Postpartum: none Hemoglobin  Date Value Range Status  11/20/2012 10.1* 12.0 - 15.0 g/dL Final  1/61/0960 45.4   Final     HCT  Date Value Range Status  11/20/2012 30.9* 36.0 - 46.0 % Final  06/06/2012 35   Final    Physical Exam:  General: alert, cooperative and no distress Lochia: appropriate Uterine Fundus: firm Incision: n/a DVT Evaluation: No evidence of DVT seen on physical exam.  Discharge Diagnoses: Term Pregnancy-delivered  Discharge Information: Date: 11/21/2012 Activity: per booklet. Pelvic rest until PP check Diet: routine Medications: PNV and Ibuprofen Condition: stable Instructions: refer to practice specific booklet Discharge to: home Follow-up Information   Follow up with [facility]. Schedule an appointment as soon as possible for a visit in 4 weeks.   Contact information:   [address] [place] [address] [phone]    Wants OP BTL> Eda to check with K. Rasette regarding eligibility  Newborn Data: Live born female  Birth Weight: 7 lb 1.9 oz (3229 g) APGAR: 9, 9  Home with mother.  Tiffany Morse 11/21/2012, 9:05 AM

## 2012-11-30 ENCOUNTER — Ambulatory Visit: Payer: Medicaid Other | Admitting: Advanced Practice Midwife

## 2012-12-28 ENCOUNTER — Ambulatory Visit (INDEPENDENT_AMBULATORY_CARE_PROVIDER_SITE_OTHER): Payer: Medicaid Other | Admitting: Obstetrics & Gynecology

## 2012-12-28 ENCOUNTER — Encounter: Payer: Self-pay | Admitting: Obstetrics & Gynecology

## 2012-12-28 DIAGNOSIS — Z3009 Encounter for other general counseling and advice on contraception: Secondary | ICD-10-CM

## 2012-12-28 MED ORDER — NORGESTIMATE-ETH ESTRADIOL 0.25-35 MG-MCG PO TABS: 1.0000 | ORAL_TABLET | Freq: Every day | ORAL | Status: DC

## 2012-12-28 NOTE — Progress Notes (Signed)
Patient ID: Tiffany Morse, female   DOB: 1980-08-31, 32 y.o.   MRN: [id number] Subjective:     Tiffany Morse is a 32 y.o. female who presents for a postpartum visit. She is 5 weeks postpartum following a spontaneous vaginal delivery. I have fully reviewed the prenatal and intrapartum course. The delivery was at term. Outcome: spontaneous vaginal delivery. Postpartum course has been uncomplicated. Baby's course has been unremarkable. Baby is feeding by bottle.. Bleeding no bleeding. Bowel function is normal. Bladder function is normal. Patient is not sexually active. Contraception method is abstinence. Postpartum depression screening: negative.  The following portions of the patient's history were reviewed and updated as appropriate: allergies, current medications, past family history, past medical history, past social history, past surgical history and problem list.  Review of Systems Pertinent items are noted in HPI.   Objective:    BP 107/68  Pulse 63  Temp(Src) 97.6 F (36.4 C)  Ht 5\' 2"  (1.575 m)  Wt 168 lb 1.6 oz (76.25 kg)  BMI 30.74 kg/m2  Breastfeeding? No  General:  alert and no distress           Abdomen: soft, non-tender; bowel sounds normal; no masses,  no organomegaly   Vulva:  normal  Vagina: normal vagina, no discharge, exudate, lesion, or erythema  Cervix:  no cervical motion tenderness  Corpus: normal size, contour, position, consistency, mobility, non-tender  Adnexa:  no mass, fullness, tenderness           Assessment:     6 week postpartum exam. Pap smear not done at today's visit.   Plan:    1. Contraception: OCP's until BTL 2. Referral to HD for +PPD with negative CXR   3. Follow up in: 1 year or as needed. Will schedule BV.   Preoperative History and Physical  Tiffany Morse is a 32 y.o. [phone] here for surgical management of sterilization   Proposed surgery: laparoscopic sterilization  Past Medical History  Diagnosis Date  .  Medical history non-contributory    Past Surgical History  Procedure Laterality Date  . No past surgeries     OB History   Grav Para Term Preterm Abortions TAB SAB Ect Mult Living   3 3 3       3      Patient denies any cervical dysplasia or STIs.  (Not in a hospital admission)  No Known Allergies Social History:   reports that she has never smoked. She has never used smokeless tobacco. She reports that she does not drink alcohol or use illicit drugs. History reviewed. No pertinent family history.  Review of Systems: Noncontributory  PHYSICAL EXAM: Blood pressure 107/68, pulse 63, temperature 97.6 F (36.4 C), height 5\' 2"  (1.575 m), weight 168 lb 1.6 oz (76.25 kg), not currently breastfeeding. General appearance - alert, well appearing, and in no distress Chest - clear to auscultation, no wheezes, rales or rhonchi, symmetric air entry Heart - normal rate and regular rhythm Abdomen - soft, nontender, nondistended, no masses or organomegaly Pelvic - examination not indicated Extremities - peripheral pulses normal, no pedal edema, no clubbing or cyanosis  Labs: No results found for this or any previous visit (from the past 336 hour(s)).  Imaging Studies: No results found.  Assessment: Patient Active Problem List   Diagnosis Date Noted  . 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 Vaginal itching 11/08/2012  sterilization- pt does not want bilateral salpingectomy because she does not want more extensive surgery.  I reviewed with her the decreased risk of ovarian cancer following salpingectomy.  She is aware but, declines that procedure.  She confirms that she is NOT foregoing that procedure because of concern of fertility preservation.  Plan: Patient will undergo surgical management with laparoscopic bilateral tubal ligation with Filshie clips.   The risks of surgery were discussed in detail with the patient including but not limited to: bleeding which  may require transfusion or reoperation; infection which may require antibiotics; injury to surrounding organs which may involve bowel, bladder, ureters ; need for additional procedures including laparoscopy or laparotomy; thromboembolic phenomenon, surgical site problems and other postoperative/anesthesia complications. Likelihood of success in alleviating the patient's condition was discussed. Routine postoperative instructions will be reviewed with the patient and her family in detail after surgery.  The patient concurred with the proposed plan, giving informed written consent for the surgery.  Patient has been NPO since last night she will remain NPO for procedure.  Anesthesia and OR aware.  Preoperative prophylactic antibiotics and SCDs ordered on call to the OR.  To OR when ready.  Referral to the health dept for further treatment of +PPD with negative CXR   Tiffany Morse Tiffany Morse, M.D., [facility] 12/28/2012 3:22 PM

## 2012-12-28 NOTE — Patient Instructions (Addendum)
Información sobre la esterilización en las mujeres   (Sterilization Information, Female)  La esterilización en la mujer es un procedimiento que se realiza para evitar el embarazo de manera permanente. Hay diferentes formas de realizar la esterilización, pero en todos los casos se bloquean o se cierran las trompas de Falopio para que óvulos no puedan llegar al útero. Si el óvulo no llega al útero, los espermatozoides no fertilizan el óvulo, y usted no podrá quedar embarazada.   La esterilización se lleva a cabo por medio de un procedimiento quirúrgico. A veces, estos procedimientos se realizan en el hospital haciendo dormir a la paciente. En otros casos se realiza en un consultorio en una clínica y la paciente permanece despierta. Las trompas de Falopio se pueden cortar, ligar o sellar quirúrgicamente, con un procedimiento que se llama ligadura de trompas. Otro método es cerrarlas con clips o anillos. La esterilización también puede realizarse mediante la colocación de un pequeño resorte en cada trompa de Falopio, lo que hace que se desarrolle tejido cicatrizal en el interior de la trompa. Luego el tejido cicatrizal obstruye las trompas.   Comente el tema con su médico para responder las inquietudes que usted o su pareja puedan tener. Podrá preguntarle a su médico qué tipo de esterilización se realizará. Algunos profesionales pueden no realizar todas las opciones. La esterilización es permanente y sólo debe hacerse si está segura de que no desea tener hijos o no desea tener más hijos. La reversión de la esterilización puede no tener éxito.   PROCEDIMIENTOS PARA LA ESTERILIZACIÓN   · Esterilización laparoscópica. Es un método quirúrgico que se realiza en otro momento que no es inmediatamente después del parto. Se realizan dos incisiones en la zona baja del abdomen. Un tubo delgado con una fuente de luz (laparoscopio) se inserta en una de las incisiones y se utiliza para realizar el procedimiento. Las trompas de  Falopio se cierran con un anillo o un clip. Podrán utilizar un instrumento que utiliza el calor para sellar las trompas (electrocauterización)..    · Mini-laparotomía. Es un método quirúrgico que se realiza 1 o 2 días después del parto. En general, consiste en una pequeña incisión que se hace justo debajo del (ombligo) por el cual se visualizan las trompas de Falopio. Las trompas pueden ser selladas, atadas o cortadas.    · Esterilización histeroscópica. Esto se realiza en otro momento que no sea inmediatamente después del parto. Se coloca un pequeño resorte helicoidal a través del cuello y del cuerpo del útero y se inserta en las trompas de Falopio. El resorte produce cicatrización y obstruye las trompas. Se deben utilizar otras formas de anticoncepción durante 3 meses después del procedimiento para permitir que el tejido cicatrizal se forme completamente. Además, es necesario hacer una histerosalpingografía después de 3 meses para asegurarse de que el procedimiento ha sido exitoso.   La histerosalpingografía es un procedimiento en el que utilizan rayos X para observar el útero y las trompas de Falopio después de insertar un dispositivo, para asegurarse de que está bien colocado.  ¿LA ESTERILIZACIÓN ES UN PROCEDIMIENTO SEGURO?   La esterilización se considera un procedimiento seguro en el que rara vez se producen complicaciones. Los riesgos dependen del tipo de procedimiento que se realicen. Al igual que con cualquier procedimiento quirúrgico, puede haber riesgos. Algunos son:   · Sangrado.  · Infección.  · Reacción a la anestesia.  · Lesión en los órganos circundantes.  Los riesgos específicos de la colocación de los resortes por histeroscopía son:   ·   No se pueden colocar correctamente la primera vez.     · Las resortes pueden salirse del lugar.    · Las trompas no quedan completamente bloqueadas después de 3 meses.    · Ocurre una lesión en los órganos adyacentes al colocarlo.    ¿LA ESTERILIZACIÓN ES UN MÉTODO  EFECTIVO?   La esterilización es efectiva en casi 100% , pero puede fallar. Según el tipo de esterilización, el porcentaje de fracaso puede ser tan alto como en un 3%. Después de la esterilización histeroscópica con colocación de un resorte en las trompas de Falopio, necesitará un método anticonceptivo de respaldo durante 3 meses. La esterilización es efectiva durante toda la vida.   LOS BENEFICIOS DE LA ESTERILIZACIÓN   · No afecta las hormonas, y por lo tanto no afectará sus períodos menstruales, el deseo o el rendimiento sexual, .    · Es efectiva para toda la vida.    · Es segura.    · No tiene que preocuparse por evitar el embarazo. Tenga en cuenta que si fue sometida a este procedimiento, debe esperar 3 meses (o hasta que el médico lo confirme) antes de considerar que no quedará embarazada.    · No hay efectos secundarios a diferencia de otros tipos de control de la natalidad (contracepción).    INCONVENIENTES DE LA ESTERILIZACIÓN   · Usted debe estar seguro de que no desea tener hijos o más hijos. El procedimiento es permanente.    · No ofrece protección contra las infecciones de transmisión sexual (ITS).    · Las trompas pueden volver a desarrollarse. Si esto ocurre, habrá riesgo de embarazo. También hay un mayor riesgo (50%) que el embarazo sea ectópico. Se llama así al embarazo que ocurre fuera del útero.  Document Released: 10/27/2007 Document Revised: 11/09/2011  ExitCare® Patient Information ©2014 [employer].

## 2013-01-05 ENCOUNTER — Encounter (HOSPITAL_COMMUNITY): Payer: Self-pay | Admitting: Obstetrics & Gynecology

## 2013-01-18 ENCOUNTER — Encounter (INDEPENDENT_AMBULATORY_CARE_PROVIDER_SITE_OTHER): Payer: Medicaid Other | Admitting: Obstetrics & Gynecology

## 2013-01-18 ENCOUNTER — Encounter: Payer: Self-pay | Admitting: Obstetrics & Gynecology

## 2013-01-18 NOTE — Progress Notes (Signed)
Patient ID: Tiffany Morse, female   DOB: [birth year], 32 y.o.   MRN: [id number] Pt not seen exam cancelled

## 2013-01-23 ENCOUNTER — Encounter: Payer: Self-pay | Admitting: *Deleted

## 2013-01-23 MED ORDER — SCOPOLAMINE 1 MG/3DAYS TD PT72
MEDICATED_PATCH | TRANSDERMAL | Status: AC
  Filled 2013-01-23: qty 1

## 2013-01-24 ENCOUNTER — Encounter: Payer: Self-pay | Admitting: *Deleted

## 2013-01-30 ENCOUNTER — Ambulatory Visit (HOSPITAL_COMMUNITY)
Admission: RE | Admit: 2013-01-30 | Payer: Medicaid Other | Source: Ambulatory Visit | Admitting: Obstetrics & Gynecology

## 2013-01-30 SURGERY — LIGATION, FALLOPIAN TUBE, LAPAROSCOPIC
Anesthesia: Choice | Site: Abdomen | Laterality: Bilateral

## 2013-03-01 ENCOUNTER — Ambulatory Visit: Payer: Medicaid Other | Admitting: Obstetrics & Gynecology

## 2013-04-23 ENCOUNTER — Encounter (HOSPITAL_COMMUNITY): Payer: Self-pay

## 2013-04-23 ENCOUNTER — Inpatient Hospital Stay (HOSPITAL_COMMUNITY)
Admission: AD | Admit: 2013-04-23 | Discharge: 2013-04-23 | Disposition: A | Discharge status: Home / Self Care | Payer: Medicaid Other | Source: Ambulatory Visit | Attending: Obstetrics & Gynecology | Admitting: Obstetrics & Gynecology

## 2013-04-23 DIAGNOSIS — L293 Anogenital pruritus, unspecified: Secondary | ICD-10-CM | POA: Insufficient documentation

## 2013-04-23 DIAGNOSIS — R109 Unspecified abdominal pain: Secondary | ICD-10-CM | POA: Insufficient documentation

## 2013-04-23 DIAGNOSIS — B081 Molluscum contagiosum: Secondary | ICD-10-CM

## 2013-04-23 DIAGNOSIS — G8929 Other chronic pain: Secondary | ICD-10-CM

## 2013-04-23 LAB — CBC
HCT: 35 % — ABNORMAL LOW (ref 36.0–46.0)
MCHC: 34 g/dL (ref 30.0–36.0)
MCV: 85 fL (ref 78.0–100.0)
RDW: 13.4 % (ref 11.5–15.5)

## 2013-04-23 LAB — WET PREP, GENITAL
Clue Cells Wet Prep HPF POC: NONE SEEN
Trich, Wet Prep: NONE SEEN

## 2013-04-23 LAB — COMPREHENSIVE METABOLIC PANEL
Albumin: 3.6 g/dL (ref 3.5–5.2)
BUN: 11 mg/dL (ref 6–23)
Calcium: 8.9 mg/dL (ref 8.4–10.5)
Creatinine, Ser: 0.53 mg/dL (ref 0.50–1.10)
Total Protein: 6.9 g/dL (ref 6.0–8.3)

## 2013-04-23 LAB — URINALYSIS, ROUTINE W REFLEX MICROSCOPIC
Glucose, UA: NEGATIVE mg/dL
Hgb urine dipstick: NEGATIVE
Ketones, ur: NEGATIVE mg/dL
Leukocytes, UA: NEGATIVE
pH: 6 (ref 5.0–8.0)

## 2013-04-23 LAB — POCT PREGNANCY, URINE: Preg Test, Ur: NEGATIVE

## 2013-04-23 NOTE — MAU Provider Note (Signed)
Subjective:  Tiffany Morse is a 82 yof G3P3003 who presents today with a one month hx of vaginal itching and abdominal pain "for years".  Pt states that approximately one month ago she went to a "general health clinic" where she had some "pimple-like" lesions burned off of her vagina after experiencing vaginal itching.  Pt states that the provider told her she had a "strong infection" at that time, however did not give her any other information.  She states that more recently the itching sensation and lesions have returned and she would like to have them evaluated.  Pt also c/o abd pain in her RLQ which she describes as being constant, and worse when she bends down.  She states she has experienced this pain for years and has had previous studies and imaging for the pain which revealed nothing definitive.  She states that the pain went away with her last child, and has now returned. She denies nausea, vomiting, diarrhea, constipation.  She denies vaginal discharge.  LMP 03/07/2013.    Patient Active Problem List   Diagnosis Date Noted  . 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 Vaginal itching 11/08/2012   Past Medical History  Diagnosis Date  . Medical history non-contributory     Past Surgical History  Procedure Laterality Date  . No past surgeries      No prescriptions prior to admission   No Known Allergies  History  Substance Use Topics  . Smoking status: Never Smoker   . Smokeless tobacco: Never Used  . Alcohol Use: No    History reviewed. No pertinent family history.  Review of Systems Pertinent items are noted in HPI.  Objective:   Patient Vitals for the past 8 hrs:  BP Temp Temp src Pulse Resp SpO2  04/23/13 1026 126/62 mmHg 98.2 F (36.8 C) Oral 81 18 100 %   BP 126/62  Pulse 81  Temp(Src) 98.2 F (36.8 C) (Oral)  Resp 18  SpO2 100%  LMP 03/07/2013 General appearance: alert, cooperative and no distress Abdomen: Minor tenderness noted in  RUQ/RLQ with negative Murphy's sign, negative rebound tenderness.   Pelvic: no adnexal masses or tenderness, no cervical motion tenderness, rectovaginal septum normal, uterus normal size, shape, and consistency, vagina normal without discharge and .  Cervical ectropion noted.  Also noted were small, round, pruritic, flesh-colored, papular lesions at 12 o'clock and 7 o'clock of the external labial folds.    Results for orders placed during the hospital encounter of 04/23/13 (from the past 24 hour(s))  URINALYSIS, ROUTINE W REFLEX MICROSCOPIC     Status: Abnormal   Collection Time    04/23/13 10:10 AM      Result Value Range   Color, Urine YELLOW  YELLOW   APPearance CLEAR  CLEAR   Specific Gravity, Urine >1.030 (*) 1.005 - 1.030   pH 6.0  5.0 - 8.0   Glucose, UA NEGATIVE  NEGATIVE mg/dL   Hgb urine dipstick NEGATIVE  NEGATIVE   Bilirubin Urine NEGATIVE  NEGATIVE   Ketones, ur NEGATIVE  NEGATIVE mg/dL   Protein, ur NEGATIVE  NEGATIVE mg/dL   Urobilinogen, UA 0.2  0.0 - 1.0 mg/dL   Nitrite NEGATIVE  NEGATIVE   Leukocytes, UA NEGATIVE  NEGATIVE  POCT PREGNANCY, URINE     Status: None   Collection Time    04/23/13 10:15 AM      Result Value Range   Preg Test, Ur NEGATIVE  NEGATIVE  WET PREP, GENITAL  Status: Abnormal   Collection Time    04/23/13 12:50 PM      Result Value Range   Yeast Wet Prep HPF POC NONE SEEN  NONE SEEN   Trich, Wet Prep NONE SEEN  NONE SEEN   Clue Cells Wet Prep HPF POC NONE SEEN  NONE SEEN   WBC, Wet Prep HPF POC FEW (*) NONE SEEN  CBC     Status: Abnormal   Collection Time    04/23/13  1:06 PM      Result Value Range   WBC 6.7  4.0 - 10.5 K/uL   RBC 4.12  3.87 - 5.11 MIL/uL   Hemoglobin 11.9 (*) 12.0 - 15.0 g/dL   HCT 16.1 (*) 09.6 - 04.5 %   MCV 85.0  78.0 - 100.0 fL   MCH 28.9  26.0 - 34.0 pg   MCHC 34.0  30.0 - 36.0 g/dL   RDW 40.9  81.1 - 91.4 %   Platelets 229  150 - 400 K/uL  COMPREHENSIVE METABOLIC PANEL     Status: Abnormal   Collection  Time    04/23/13  1:06 PM      Result Value Range   Sodium 136  135 - 145 mEq/L   Potassium 3.9  3.5 - 5.1 mEq/L   Chloride 103  96 - 112 mEq/L   CO2 24  19 - 32 mEq/L   Glucose, Bld 95  70 - 99 mg/dL   BUN 11  6 - 23 mg/dL   Creatinine, Ser 7.82  0.50 - 1.10 mg/dL   Calcium 8.9  8.4 - 95.6 mg/dL   Total Protein 6.9  6.0 - 8.3 g/dL   Albumin 3.6  3.5 - 5.2 g/dL   AST 27  0 - 37 U/L   ALT 36 (*) 0 - 35 U/L   Alkaline Phosphatase 111  39 - 117 U/L   Total Bilirubin 0.4  0.3 - 1.2 mg/dL   GFR calc non Af Amer >90  >90 mL/min   GFR calc Af Amer >90  >90 mL/min    Assessment/Plan MDM    CBC, CMP, GC/Chlamydia, Vaginal wet-prep, Urine HCG  Evaluation and management procedures were performed by the PA student under my supervision and collaboration. I have reviewed the note and chart, and I agree with the management and plan.   A: Molluscum Contagiosum Chronic abdominal pain  P: Discharge home Referral to GYN clinic Return to MAU as needed, if symptoms worsen  Ok to take tylenol or ibuprofen as directed on the bottle  Proper hand washing, and care at home with partner discussed  Iona Hansen Rasch, NP 04/23/2013 2:18 PM

## 2013-04-23 NOTE — MAU Note (Signed)
Patient is in with c/o vaginal irritation/itching and intermittent lower abdominal pain (she rates a 4/10) and missed period. She has a 23 month old infant (vaginal delivery). Her lmp 10/15. She denies dysuria or vaginal bleeding.

## 2013-04-23 NOTE — MAU Provider Note (Signed)
Attestation of Attending Supervision of Advanced Practitioner (PA/CNM/NP): Evaluation and management procedures were performed by the Advanced Practitioner under my supervision and collaboration.  I have reviewed the Advanced Practitioner's note and chart, and I agree with the management and plan.  Marae Cottrell, MD, FACOG Attending Obstetrician & Gynecologist Faculty Practice, [facility]  

## 2013-04-24 LAB — GC/CHLAMYDIA PROBE AMP: GC Probe RNA: NEGATIVE

## 2013-05-01 ENCOUNTER — Encounter: Payer: Self-pay | Admitting: Obstetrics & Gynecology

## 2013-06-13 ENCOUNTER — Encounter: Payer: Medicaid Other | Admitting: Obstetrics & Gynecology

## 2014-03-25 ENCOUNTER — Encounter (HOSPITAL_COMMUNITY): Payer: Self-pay

## 2015-04-15 IMAGING — CR DG CHEST 2V
2 series · 2 of 2 positions shown · non-contrast
Comparison: None.

CLINICAL DATA: Positive PPD

CHEST - 2 VIEW

[view not recorded (1 of 2)]
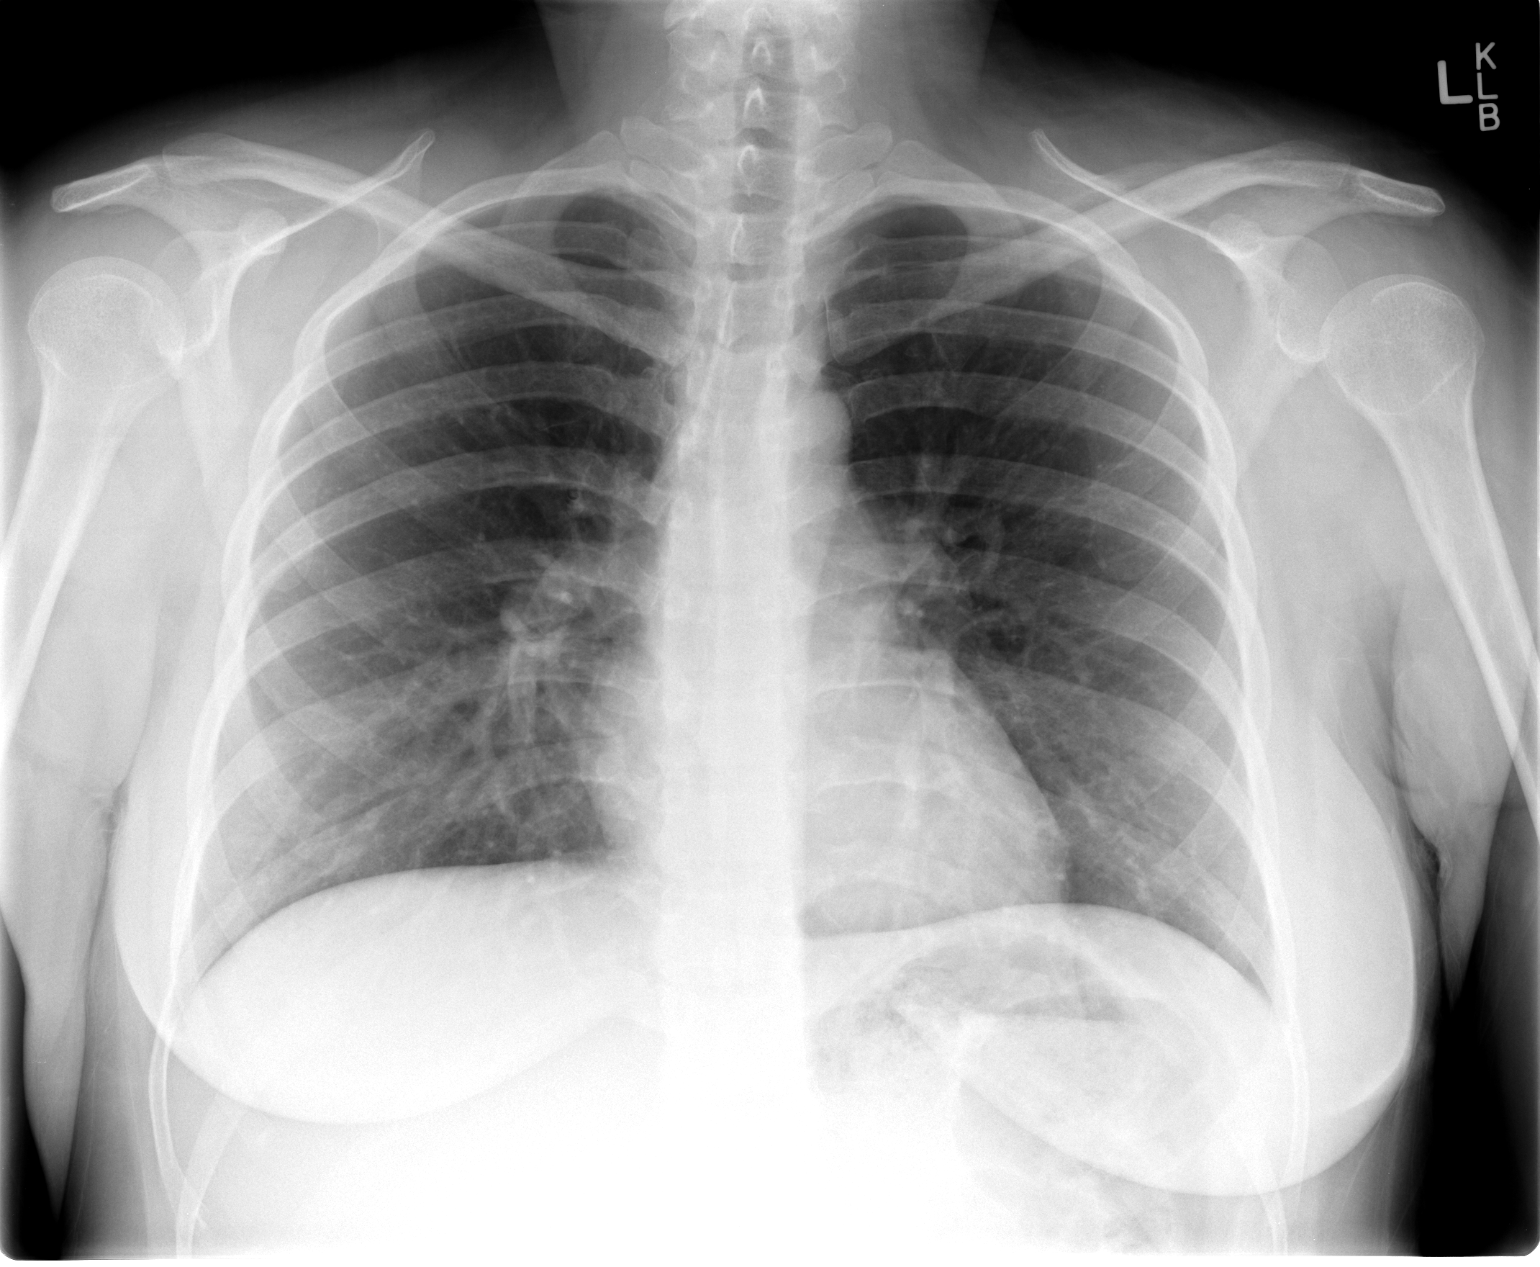

[view not recorded (2 of 2)]
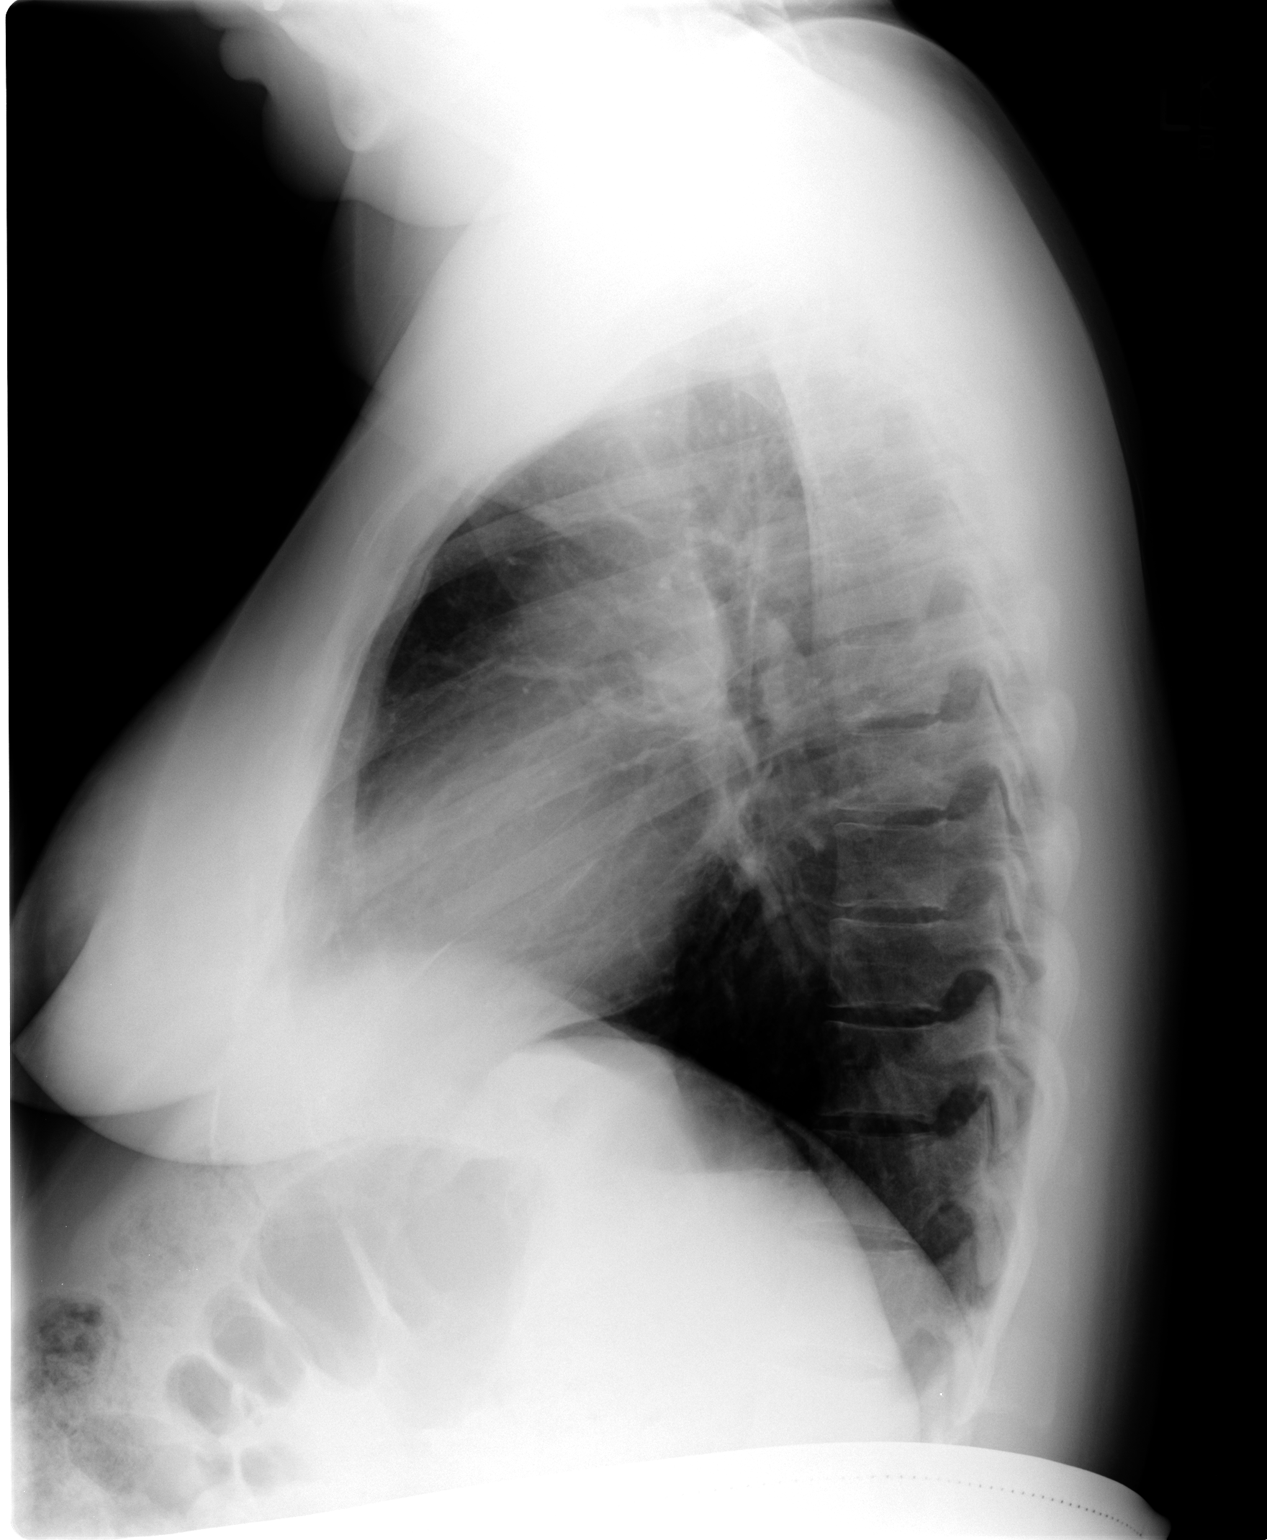

[2 of 2 positions shown; findings below may reference images not displayed]

FINDINGS: The heart and pulmonary vascularity are within normal
limits.  The lungs are clear bilaterally.  No bony abnormality is
seen.
IMPRESSION: No acute intrathoracic abnormality.

## 2016-02-18 ENCOUNTER — Ambulatory Visit: Payer: Medicaid Other | Admitting: Obstetrics & Gynecology

## 2016-04-01 ENCOUNTER — Ambulatory Visit (INDEPENDENT_AMBULATORY_CARE_PROVIDER_SITE_OTHER): Payer: Medicaid Other | Admitting: Obstetrics & Gynecology

## 2016-04-01 ENCOUNTER — Encounter: Payer: Self-pay | Admitting: Obstetrics & Gynecology

## 2016-04-01 VITALS — BP 106/80 | HR 83 | Wt 167.9 lb

## 2016-04-01 DIAGNOSIS — Z1151 Encounter for screening for human papillomavirus (HPV): Secondary | ICD-10-CM | POA: Diagnosis not present

## 2016-04-01 DIAGNOSIS — Z30011 Encounter for initial prescription of contraceptive pills: Secondary | ICD-10-CM

## 2016-04-01 DIAGNOSIS — Z124 Encounter for screening for malignant neoplasm of cervix: Secondary | ICD-10-CM

## 2016-04-01 DIAGNOSIS — Z01419 Encounter for gynecological examination (general) (routine) without abnormal findings: Secondary | ICD-10-CM

## 2016-04-01 MED ORDER — IBUPROFEN 800 MG PO TABS: 800.0000 mg | ORAL_TABLET | Freq: Three times a day (TID) | ORAL | 1 refills | Status: DC | PRN

## 2016-04-01 MED ORDER — NORGESTREL-ETHINYL ESTRADIOL 0.3-30 MG-MCG PO TABS: 1.0000 | ORAL_TABLET | Freq: Every day | ORAL | 11 refills | Status: DC

## 2016-04-01 NOTE — Progress Notes (Signed)
Subjective:    Tiffany Morse is a 35 y.o. MH P3  female who presents for an annual exam. The patient has no complaints today. She has some pelvic pain "at times", not related to her periods, has not tried any OTC pain meds. The patient is sexually active. GYN screening history: last pap: was normal. The patient wears seatbelts: yes. The patient participates in regular exercise: no. Has the patient ever been transfused or tattooed?: no. The patient reports that there is not domestic violence in her life.   Menstrual History: OB History    Gravida Para Term Preterm AB Living   3 3 3     3    SAB TAB Ectopic Multiple Live Births           3      Menarche age: [age] No LMP recorded. Patient is not currently having periods (Reason: Needs Pregnancy Test).    The following portions of the patient's history were reviewed and updated as appropriate: allergies, current medications, past family history, past medical history, past social history, past surgical history and problem list.  Review of Systems Pertinent items are noted in HPI.   She is from [place] She has been taking OCPs from [place] but ran out last week. She has not had sex since she stopped her OCPs.   Objective:    BP 106/80   Pulse 83   Wt 167 lb 14.4 oz (76.2 kg)   BMI 30.71 kg/m   General Appearance:    Alert, cooperative, no distress, appears stated age  Head:    Normocephalic, without obvious abnormality, atraumatic  Eyes:    PERRL, conjunctiva/corneas clear, EOM's intact, fundi    benign, both eyes  Ears:    Normal TM's and external ear canals, both ears  Nose:   Nares normal, septum midline, mucosa normal, no drainage    or sinus tenderness  Throat:   Lips, mucosa, and tongue normal; teeth and gums normal  Neck:   Supple, symmetrical, trachea midline, no adenopathy;    thyroid:  no enlargement/tenderness/nodules; no carotid   bruit or JVD  Back:     Symmetric, no curvature, ROM normal, no CVA tenderness   Lungs:     Clear to auscultation bilaterally, respirations unlabored  Chest Wall:    No tenderness or deformity   Heart:    Regular rate and rhythm, S1 and S2 normal, no murmur, rub   or gallop  Breast Exam:    No tenderness, masses, or nipple abnormality  Abdomen:     Soft, non-tender, bowel sounds active all four quadrants,    no masses, no organomegaly  Genitalia:    Normal female without lesion, discharge or tenderness, NSSA, NT, mobile, normal adnexal exam     Extremities:   Extremities normal, atraumatic, no cyanosis or edema  Pulses:   2+ and symmetric all extremities  Skin:   Skin color, texture, turgor normal, no rashes or lesions  Lymph nodes:   Cervical, supraclavicular, and axillary nodes normal  Neurologic:   CNII-XII intact, normal strength, sensation and reflexes    throughout   .    Assessment:    Healthy female exam.   Contraception   Plan:     Thin prep Pap smear. with cotesting Start Lo ovral today. Use back up method  For 1 month

## 2016-04-02 LAB — CYTOLOGY - PAP
Diagnosis: NEGATIVE
HPV (WINDOPATH): NOT DETECTED

## 2016-04-28 ENCOUNTER — Encounter (HOSPITAL_COMMUNITY): Payer: Self-pay | Admitting: *Deleted

## 2016-04-28 ENCOUNTER — Ambulatory Visit (HOSPITAL_COMMUNITY)
Admission: EM | Admit: 2016-04-28 | Discharge: 2016-04-28 | Disposition: A | Discharge status: Home / Self Care | Payer: Self-pay | Attending: Emergency Medicine | Admitting: Emergency Medicine

## 2016-04-28 DIAGNOSIS — J020 Streptococcal pharyngitis: Secondary | ICD-10-CM

## 2016-04-28 MED ORDER — AMOXICILLIN 875 MG PO TABS: 875.0000 mg | ORAL_TABLET | Freq: Two times a day (BID) | ORAL | 0 refills | Status: DC

## 2016-04-28 MED ORDER — LIDOCAINE VISCOUS 2 % MT SOLN: 20.0000 mL | OROMUCOSAL | 0 refills | Status: DC | PRN

## 2016-04-28 NOTE — ED Provider Notes (Signed)
CSN: [id number]     Arrival date & time 04/28/16  1233 History   None    No chief complaint on file.  (Consider location/radiation/quality/duration/timing/severity/associated sxs/prior Treatment) Patient c/o fever, headache, sore throat for 3 days.      Sore Throat  This is a new problem. The problem occurs constantly. Associated symptoms include headaches. The symptoms are aggravated by stress. Nothing relieves the symptoms. She has tried nothing for the symptoms.    Past Medical History:  Diagnosis Date  . Medical history non-contributory    Past Surgical History:  Procedure Laterality Date  . NO PAST SURGERIES     History reviewed. No pertinent family history. Social History  Substance Use Topics  . Smoking status: Never Smoker  . Smokeless tobacco: Never Used  . Alcohol use No   OB History    Gravida Para Term Preterm AB Living   3 3 3     3    SAB TAB Ectopic Multiple Live Births           3     Review of Systems  Constitutional: Negative.   HENT: Positive for sore throat.   Eyes: Negative.   Cardiovascular: Negative.   Gastrointestinal: Negative.   Endocrine: Negative.   Genitourinary: Negative.   Musculoskeletal: Negative.   Skin: Negative.   Allergic/Immunologic: Negative.   Neurological: Positive for headaches.  Hematological: Negative.   Psychiatric/Behavioral: Negative.     Allergies  Patient has no known allergies.  Home Medications   Prior to Admission medications   Medication Sig Start Date End Date Taking? Authorizing Provider  amoxicillin (AMOXIL) 875 MG tablet Take 1 tablet (875 mg total) by mouth 2 (two) times daily. 04/28/16   Deatra CanterWilliam J Garlan Drewes, FNP  ibuprofen (ADVIL,MOTRIN) 800 MG tablet Take 1 tablet (800 mg total) by mouth every 8 (eight) hours as needed. 04/01/16   Allie BossierMyra C Dove, MD  lidocaine (XYLOCAINE) 2 % solution Use as directed 20 mLs in the mouth or throat as needed for mouth pain. 04/28/16   Deatra CanterWilliam J Haroldine Redler, FNP   norgestrel-ethinyl estradiol (LO/OVRAL,CRYSELLE) 0.3-30 MG-MCG tablet Take 1 tablet by mouth daily. 04/01/16   Allie BossierMyra C Dove, MD   Meds Ordered and Administered this Visit  Medications - No data to display  BP 104/70   Pulse 78   Temp 98.4 F (36.9 C) (Oral)   Resp 18   LMP 04/25/2016   SpO2 99%  No data found.   Physical Exam  Constitutional: She is oriented to person, place, and time. She appears well-developed and well-nourished.  HENT:  Head: Normocephalic and atraumatic.  Right Ear: External ear normal.  Left Ear: External ear normal.  OPX - Erythematous tonsils 2 plus with exudates.  Eyes: Conjunctivae and EOM are normal. Pupils are equal, round, and reactive to light.  Neck: Normal range of motion. Neck supple.  Cardiovascular: Normal rate, regular rhythm and normal heart sounds.   Pulmonary/Chest: Effort normal and breath sounds normal.  Abdominal: Soft. Bowel sounds are normal.  Musculoskeletal: Normal range of motion.  Neurological: She is alert and oriented to person, place, and time.  Nursing note and vitals reviewed.   Urgent Care Course   Clinical Course     Procedures (including critical care time)  Labs Review Labs Reviewed - No data to display  Imaging Review No results found.   Visual Acuity Review  Right Eye Distance:   Left Eye Distance:   Bilateral Distance:    Right Eye Near:  Left Eye Near:    Bilateral Near:         MDM   1. Pharyngitis due to Streptococcus species    Amoxicillin 875mg  one po bid x 7 days #14  Lidocaine 2% 20 ml po q 4 hours prn #17100ml  Push po fluids, rest, tylenol and motrin otc prn as directed for fever, arthralgias, and myalgias.  Follow up prn if sx's continue or persist.    Deatra CanterWilliam J Lekita Kerekes, FNP 04/28/16 1332

## 2016-05-10 ENCOUNTER — Ambulatory Visit: Payer: Medicaid Other | Admitting: *Deleted

## 2016-05-10 DIAGNOSIS — N898 Other specified noninflammatory disorders of vagina: Secondary | ICD-10-CM

## 2016-05-10 NOTE — Progress Notes (Signed)
Here with c/o vaginal itching , irritation. Recently on antibiotic. Denies need for std testing. Wet prep collected by patient and sent. Used interpreter Elna Breslowarol Hernandez.

## 2016-05-11 LAB — WET PREP, GENITAL: TRICH WET PREP: NONE SEEN

## 2016-05-20 ENCOUNTER — Encounter: Payer: Self-pay | Admitting: General Practice

## 2016-05-20 ENCOUNTER — Telehealth: Payer: Self-pay | Admitting: General Practice

## 2016-05-20 DIAGNOSIS — B9689 Other specified bacterial agents as the cause of diseases classified elsewhere: Secondary | ICD-10-CM

## 2016-05-20 DIAGNOSIS — B379 Candidiasis, unspecified: Secondary | ICD-10-CM

## 2016-05-20 DIAGNOSIS — N760 Acute vaginitis: Secondary | ICD-10-CM

## 2016-05-20 MED ORDER — METRONIDAZOLE 500 MG PO TABS: 500.0000 mg | ORAL_TABLET | Freq: Two times a day (BID) | ORAL | 0 refills | Status: DC

## 2016-05-20 MED ORDER — FLUCONAZOLE 150 MG PO TABS: 150.0000 mg | ORAL_TABLET | Freq: Once | ORAL | 0 refills | Status: AC

## 2016-05-20 NOTE — Telephone Encounter (Signed)
Per Dr Adrian BlackwaterStinson, patient has BV and a yeast infection & needs Rx for flagyl and diflucan. meds ordered. Called patient with pacific interpreter [phone], no answer- left message stating we are trying to reach you with non urgent results, please call us back. Will send letter

## 2018-08-24 ENCOUNTER — Telehealth: Payer: Self-pay | Admitting: Family Medicine

## 2018-08-24 ENCOUNTER — Other Ambulatory Visit: Payer: Self-pay | Admitting: Internal Medicine

## 2018-08-24 ENCOUNTER — Other Ambulatory Visit: Payer: Self-pay

## 2018-08-24 ENCOUNTER — Ambulatory Visit
Admission: RE | Admit: 2018-08-24 | Discharge: 2018-08-24 | Disposition: A | Discharge status: Home / Self Care | Payer: No Typology Code available for payment source | Source: Ambulatory Visit | Attending: Internal Medicine | Admitting: Internal Medicine

## 2018-08-24 DIAGNOSIS — R7611 Nonspecific reaction to tuberculin skin test without active tuberculosis: Secondary | ICD-10-CM

## 2018-08-24 NOTE — Telephone Encounter (Signed)
Tiffany Morse from the [facility] called to schedule an appointment for 2 week follow-up per Dr. Jolayne Panther. She stated the patient is currently experiencing an SAB. She declined cyctotec medication, stated she wanted to try and pass the remnants on her own. Understands the visit is a virtual visit.

## 2018-08-24 NOTE — Telephone Encounter (Signed)
Called the patient to inform of upcoming appointment. Left a detailed message and also sending a reminder letter.

## 2018-08-29 ENCOUNTER — Encounter: Payer: Self-pay | Admitting: *Deleted

## 2018-08-31 ENCOUNTER — Inpatient Hospital Stay (HOSPITAL_COMMUNITY): Payer: Medicaid Other

## 2018-08-31 ENCOUNTER — Encounter: Payer: Self-pay | Admitting: Student

## 2018-08-31 ENCOUNTER — Inpatient Hospital Stay (HOSPITAL_COMMUNITY)
Admission: AD | Admit: 2018-08-31 | Discharge: 2018-08-31 | Disposition: A | Discharge status: Home / Self Care | Payer: Medicaid Other | Attending: Obstetrics & Gynecology | Admitting: Obstetrics & Gynecology

## 2018-08-31 DIAGNOSIS — Z792 Long term (current) use of antibiotics: Secondary | ICD-10-CM | POA: Diagnosis not present

## 2018-08-31 DIAGNOSIS — O209 Hemorrhage in early pregnancy, unspecified: Secondary | ICD-10-CM

## 2018-08-31 DIAGNOSIS — O039 Complete or unspecified spontaneous abortion without complication: Secondary | ICD-10-CM

## 2018-08-31 DIAGNOSIS — R9389 Abnormal findings on diagnostic imaging of other specified body structures: Secondary | ICD-10-CM | POA: Diagnosis not present

## 2018-08-31 LAB — CBC
HCT: 34.1 % — ABNORMAL LOW (ref 36.0–46.0)
Hemoglobin: 11.6 g/dL — ABNORMAL LOW (ref 12.0–15.0)
MCH: 30.7 pg (ref 26.0–34.0)
MCHC: 34 g/dL (ref 30.0–36.0)
MCV: 90.2 fL (ref 80.0–100.0)
Platelets: 256 10*3/uL (ref 150–400)
RBC: 3.78 MIL/uL — ABNORMAL LOW (ref 3.87–5.11)
RDW: 12.5 % (ref 11.5–15.5)
WBC: 10.8 10*3/uL — ABNORMAL HIGH (ref 4.0–10.5)
nRBC: 0 % (ref 0.0–0.2)

## 2018-08-31 MED ORDER — OXYCODONE-ACETAMINOPHEN 5-325 MG PO TABS: 1.0000 | ORAL_TABLET | ORAL | 0 refills | Status: DC | PRN

## 2018-08-31 MED ORDER — PROMETHAZINE HCL 25 MG/ML IJ SOLN
12.5000 mg | Freq: Once | INTRAMUSCULAR | Status: AC
  Administered 2018-08-31: 16:00:00 12.5 mg via INTRAVENOUS
  Filled 2018-08-31: qty 1

## 2018-08-31 MED ORDER — HYDROMORPHONE HCL 1 MG/ML IJ SOLN
1.0000 mg | Freq: Once | INTRAMUSCULAR | Status: AC
  Administered 2018-08-31: 16:00:00 1 mg via INTRAVENOUS
  Filled 2018-08-31: qty 1

## 2018-08-31 MED ORDER — LACTATED RINGERS IV BOLUS
1000.0000 mL | Freq: Once | INTRAVENOUS | Status: AC
Start: 2018-08-31 — End: 2018-08-31
  Administered 2018-08-31: 16:00:00 1000 mL via INTRAVENOUS

## 2018-08-31 MED ORDER — MISOPROSTOL 200 MCG PO TABS
800.0000 ug | ORAL_TABLET | Freq: Once | ORAL | Status: AC
  Administered 2018-08-31: 17:00:00 800 ug via BUCCAL
  Filled 2018-08-31: qty 4

## 2018-08-31 NOTE — MAU Note (Signed)
Pt brought from [facility] entrance via Doctor, general practice.  Pt bleeding.  Crotch of pants, soaked in blood. Lower part of shirt and sweater soaked.  Clots noted in clothing.  Attending and RROB nurse present for transfer.  Pt reports 11wks preg.  Started cramping 2 hrs ago, bleeding an hour ago, translator called ([phone])  Pt states was told this would happen.  Assisted pt with changing into gown, partial bed bath.

## 2018-08-31 NOTE — MAU Provider Note (Signed)
Chief Complaint: Vaginal Bleeding   First Provider Initiated Contact with Patient 08/31/18 1449     *Spanish interpreter at bedside for this visit*  SUBJECTIVE HPI: Tiffany Morse is a 38 y.o. G4P3003 at 730w5d who presents to Maternity Admissions reporting vaginal bleeding & abdominal pain.  Was [facility] at [facility] that she was going to have a miscarriage.  Reports heavy vaginal bleeding that started today. Also reports abdominal pain with bleeding. Has passed several clots. Denies dizziness or syncope.   Location: abdomen Quality: cramping Severity: 8/10 on pain scale Duration: 1 day Timing: intermittent Modifying factors: none Associated signs and symptoms: vaginal bleeding  Past Medical History:  Diagnosis Date  . Medical history non-contributory    OB History  Gravida Para Term Preterm AB Living  4 3 3     3   SAB TAB Ectopic Multiple Live Births          3    # Outcome Date GA Lbr Len/2nd Weight Sex Delivery Anes PTL Lv  4 Current           3 Term 11/20/12 5267w6d 07:45 / 00:04 3229 g F Vag-Spont Local  LIV     Birth Comments: none  2 Term 06/04/06 6443w0d 02:00 3175 g F Vag-Spont None  LIV  1 Term 02/27/00 9043w0d 04:00 2722 g M Vag-Spont None  LIV   Past Surgical History:  Procedure Laterality Date  . NO PAST SURGERIES     Social History   Socioeconomic History  . Marital status: Married    Spouse name: Not on file  . Number of children: Not on file  . Years of education: Not on file  . Highest education level: Not on file  Occupational History  . Not on file  Social Needs  . Financial resource strain: Not on file  . Food insecurity:    Worry: Not on file    Inability: Not on file  . Transportation needs:    Medical: Not on file    Non-medical: Not on file  Tobacco Use  . Smoking status: Never Smoker  . Smokeless tobacco: Never Used  Substance and Sexual Activity  . Alcohol use: No  . Drug use: No  . Sexual activity: Not Currently    Birth  control/protection: None  Lifestyle  . Physical activity:    Days per week: Not on file    Minutes per session: Not on file  . Stress: Not on file  Relationships  . Social connections:    Talks on phone: Not on file    Gets together: Not on file    Attends religious service: Not on file    Active member of club or organization: Not on file    Attends meetings of clubs or organizations: Not on file    Relationship status: Not on file  . Intimate partner violence:    Fear of current or ex partner: Not on file    Emotionally abused: Not on file    Physically abused: Not on file    Forced sexual activity: Not on file  Other Topics Concern  . Not on file  Social History Narrative  . Not on file   History reviewed. No pertinent family history. No current facility-administered medications on file prior to encounter.    Current Outpatient Medications on File Prior to Encounter  Medication Sig Dispense Refill  . ibuprofen (ADVIL,MOTRIN) 800 MG tablet Take 1 tablet (800 mg total) by mouth every 8 (eight) hours  as needed. 60 tablet 1  . metroNIDAZOLE (FLAGYL) 500 MG tablet Take 1 tablet (500 mg total) by mouth 2 (two) times daily. 14 tablet 0  . norgestrel-ethinyl estradiol (LO/OVRAL,CRYSELLE) 0.3-30 MG-MCG tablet Take 1 tablet by mouth daily. 1 Package 11   No Known Allergies  I have reviewed patient's Past Medical Hx, Surgical Hx, Family Hx, Social Hx, medications and allergies.   Review of Systems  Constitutional: Negative.   Gastrointestinal: Positive for abdominal pain. Negative for diarrhea, nausea and vomiting.  Genitourinary: Positive for vaginal bleeding.  Neurological: Positive for headaches. Negative for dizziness and syncope.    OBJECTIVE Patient Vitals for the past 24 hrs:  BP Temp Temp src Pulse Resp SpO2  08/31/18 1706 117/71 - - - - -  08/31/18 1451 109/70 99.4 F (37.4 C) Oral 87 18 99 %   Constitutional: Well-developed, well-nourished female in no acute  distress.  Cardiovascular: normal rate & rhythm, no murmur Respiratory: normal rate and effort. Lung sounds clear throughout GI: Abd soft, non-tender, Pos BS x 4. No guarding or rebound tenderness MS: Extremities nontender, no edema, normal ROM Neurologic: Alert and oriented x 4.  GU:     SPECULUM EXAM: NEFG, moderate amount of dark red blood, tissue visualized os, unable to remove all tissue    LAB RESULTS Results for orders placed or performed during the hospital encounter of 08/31/18 (from the past 24 hour(s))  CBC     Status: Abnormal   Collection Time: 08/31/18  3:43 PM  Result Value Ref Range   WBC 10.8 (H) 4.0 - 10.5 K/uL   RBC 3.78 (L) 3.87 - 5.11 MIL/uL   Hemoglobin 11.6 (L) 12.0 - 15.0 g/dL   HCT 13.0 (L) 86.5 - 78.4 %   MCV 90.2 80.0 - 100.0 fL   MCH 30.7 26.0 - 34.0 pg   MCHC 34.0 30.0 - 36.0 g/dL   RDW 69.6 29.5 - 28.4 %   Platelets 256 150 - 400 K/uL   nRBC 0.0 0.0 - 0.2 %    IMAGING US Ob Less Than 14 Weeks With Ob Transvaginal  Result Date: 08/31/2018 CLINICAL DATA:  Vaginal bleeding EXAM: OBSTETRIC <14 WK [place] AND TRANSVAGINAL OB US TECHNIQUE: Both transabdominal and transvaginal ultrasound examinations were performed for complete evaluation of the gestation as well as the maternal uterus, adnexal regions, and pelvic cul-de-sac. Transvaginal technique was performed to assess early pregnancy. COMPARISON:  None. FINDINGS: Intrauterine gestational sac: Not visualized Yolk sac:  Not visualized Embryo:  Not visualized Cardiac Activity: Not visualized Subchorionic hemorrhage:  None visualized. Maternal uterus/adnexae: Uterus is anteverted. Endometrium is thickened measuring 2.4 cm in thickness. The endometrium has a heterogeneous echotexture. Right ovary measures 3.3 x 1.8 x 1.7 cm. Left ovary measures 1.7 x 3.2 x 2.5 cm. There is no extrauterine pelvic or adnexal mass. No free pelvic fluid. IMPRESSION: No intrauterine gestation apparent. Thickened endometrium with inhomogeneous  echotexture. Suspect recent spontaneous abortion. Hemorrhage within the endometrium or possible retained products of conception cannot be excluded. Clinical assessment and correlation with beta HCG in this regard advised. Assuming positive beta HCG, beta HCG should be followed to 0 to exclude less likely possibility of ectopic gestation. Study otherwise unremarkable. Electronically Signed   By: Bretta Bang III M.D.   On: 08/31/2018 16:37    MAU COURSE Orders Placed This Encounter  Procedures  . US OB LESS THAN 14 WEEKS WITH OB TRANSVAGINAL  . CBC  . Discharge patient   Meds ordered this encounter  Medications  . lactated ringers bolus 1,000 mL  . HYDROmorphone (DILAUDID) injection 1 mg  . promethazine (PHENERGAN) injection 12.5 mg  . misoprostol (CYTOTEC) tablet 800 mcg  . oxyCODONE-acetaminophen (PERCOCET) 5-325 MG tablet    Sig: Take 1 tablet by mouth every 4 (four) hours as needed for up to 6 doses for severe pain.    Dispense:  6 tablet    Refill:  0    Order Specific Question:   Supervising Provider    Answer:   Jaynie Collins A [[id number]]    MDM RH positive Per review of scanned records in media: Patient had fetal demise diagnosed at her new ob visit when they couldn't get fetal heart tones. She had an ultrasound that showed demise (measurements not given). Was given option for cytotec vs expectant management, pt chose expectant management.   Ultrasound shows no IUP. Thickened endometrium. C/w Dr. Macon Large. Patient with stable VS & hemoglobin, will give dose of cytotec prior to discharge.   ASSESSMENT 1. Miscarriage   2. Vaginal bleeding in pregnancy, first trimester     PLAN Discharge home in stable condition. Bleeding precautions Miscarriage f/u appt  Allergies as of 08/31/2018   No Known Allergies     Medication List    STOP taking these medications   ibuprofen 800 MG tablet Commonly known as:  ADVIL,MOTRIN   metroNIDAZOLE 500 MG tablet Commonly known as:   FLAGYL   norgestrel-ethinyl estradiol 0.3-30 MG-MCG tablet Commonly known as:  LO/OVRAL,CRYSELLE     TAKE these medications   oxyCODONE-acetaminophen 5-325 MG tablet Commonly known as:  Percocet Take 1 tablet by mouth every 4 (four) hours as needed for up to 6 doses for severe pain.        Judeth Horn, NP 08/31/2018  5:30 PM

## 2018-08-31 NOTE — Discharge Instructions (Signed)
Aborto espontáneo  Miscarriage  El aborto espontáneo es la pérdida de un bebé que no ha nacido (feto) antes de la semana 20 del embarazo. La mayor parte de los abortos espontáneos ocurre durante los primeros 3 meses de embarazo. A veces, un aborto ocurre antes de que la mujer sepa que está embarazada.  El aborto espontáneo puede ser una experiencia que afecte emocionalmente a la persona. Si ha sufrido un aborto espontáneo, hable con su médico y hágale las preguntas que tenga sobre el aborto espontáneo, el proceso de duelo y los planes futuros de embarazo.  ¿Cuáles son las causas?  Entre las causas de un aborto espontáneo se incluyen las siguientes:  · Problemas genéticos o cromosómicos del feto. Estos problemas impiden que el bebé se desarrolle con normalidad. En general, son el resultado de errores fortuitos que ocurren en la etapa temprana del desarrollo y que no se transmiten de padres a hijos (no se heredan).  · Infección en el cuello del útero.  · Trastornos que afectan el equilibrio hormonal del organismo.  · Problemas en el cuello del útero, como su adelgazamiento y apertura antes de que el embarazo llegue a término (insuficiencia del cuello de útero).  · Problemas en el útero. Estos pueden incluir, entre otros, los siguientes:  ? Forma anormal del útero.  ? Fibromas en el útero.  ? Anormalidades congénitas. Estos son problemas que ya estaban presentes en el nacimiento.  · Ciertas enfermedades crónicas.  · Fumar, beber alcohol o usar drogas.  · Lesiones (traumatismos).  En muchos de los casos, se desconoce la causa de los abortos espontáneos.  ¿Cuáles son los signos o los síntomas?  Los síntomas de esta afección incluyen los siguientes:  · Sangrado o manchado vaginal, con o sin cólicos o dolor.  · Dolor o cólicos en el abdomen o en la parte inferior de la espalda.  · Eliminación de líquido, tejidos o coágulos sanguíneos por la vagina.  ¿Cómo se diagnostica?  Esta afección se puede diagnosticar en función de  lo siguiente:  · Examen físico.  · Ecografía.  · Análisis de sangre.  · Análisis de orina.  ¿Cómo se trata?  En algunos casos, el tratamiento de un aborto espontáneo no es necesario si se eliminan de forma natural todos los tejidos que se encontraban en el útero. Si fuera necesario realizar un tratamiento por esta afección, este puede incluir lo siguiente:  · Dilatación y curetaje (D&C). Mediante este procedimiento, se expande el cuello del útero y se raspan las paredes (endometrio). Esto se realiza solamente si queda tejido del feto o la placenta dentro del cuerpo (aborto espontáneo incompleto).  · Medicamentos, por ejemplo:  ? Antibióticos para tratar una infección.  ? Medicamentos para ayudar al cuerpo a eliminar los restos de tejido.  ? Medicamentos para reducir (contraer) el tamaño del útero. Estos medicamentos se pueden administrar si tiene un sangrado abundante.  Si su factor sanguíneo es Rh negativo y el de su bebé es Rh positivo, usted necesitará una inyección del medicamento llamado inmunoglobulina Rhpara proteger a los bebés futuros de tener problemas con el factor sanguíneo Rh. Los términos "Rh negativo" y "Rh positivo" hacen referencia a la presencia o no en la sangre de una proteína específica que se encuentra en la superficie de los glóbulos rojos (factor Rh).  Siga estas indicaciones en su casa:  Medicamentos    · Tome los medicamentos de venta libre y los recetados solamente como se lo haya indicado el médico.  · Si le recetaron antibióticos, tómelos como se lo haya indicado   el médico. No deje de tomar los antibióticos aunque comience a sentirse mejor.  · No tome antiinflamatorios no esteroideos (AINE), tales como aspirina e ibuprofeno, a menos que se lo indique el médico. Estos medicamentos pueden provocarle sangrado.  Actividad  · Haga reposo según lo indicado. Pregúntele al médico qué actividades son seguras para usted.  · Pídale a alguien que la ayude con las responsabilidades familiares y del  hogar durante este tiempo.  Instrucciones generales  · Lleve un registro de la cantidad y la saturación de las toallas higiénicas que utiliza cada día. Anote esta información.  · Anote la cantidad de tejido o coágulos sanguíneos que expulsa por la vagina. Guarde las cantidades grandes de tejidos para que el médico los examine.  · No use tampones, no se haga duchas vaginales ni tenga relaciones sexuales hasta que el médico la autorice.  · Para que usted y su pareja puedan sobrellevar el proceso del duelo, hable con su médico o busque apoyo psicológico.  · Cuando esté lista, visite a su médico para hablar sobre los pasos importantes que deberá seguir en relación con su salud. También hable sobre las medidas que deberá tomar para tener un embarazo saludable en el futuro.  · Concurra a todas las visitas de seguimiento como se lo haya indicado el médico. Esto es importante.  Dónde encontrar más información  · Colegio Est[place]oun[place]ense de Obstetras y Ginecólogos (American College of Obstetricians and Gynecologists): www.acog.org  · Departamento de Salud y Servicios Humanos de los [place], Oficina de Salud de la Mujer (U.S. Department of Health and Human Services, Office on Women’s Health): www.womenshealth.gov  Comuníquese con un médico si:  · Tiene fiebre o siente escalofríos.  · Tiene una secreción vaginal con mal olor.  · El sangrado aumenta en vez de disminuir.  Solicite ayuda de inmediato si:  · Siente calambres intensos o dolor en la espalda o en el abdomen.  · Elimina coágulos de sangre o tejido por la vagina del tamaño de una nuez o más grandes.  · Necesita más de una toalla higiénica de tamaño regular por hora.  · Se siente mareada o débil.  · Se desmaya.  · Siente una tristeza que la invade o piensa en lastimarse.  Resumen  · La mayor parte de los abortos espontáneos ocurre durante los primeros 3 meses de embarazo. En algunos casos, el aborto espontáneo ocurre antes de que la mujer sepa que está  embarazada.  · Siga las indicaciones del médico para el cuidado en el hogar. Concurra a todas las visitas de control.  · Para que usted y su pareja puedan sobrellevar el proceso del duelo, hable con su médico o busque apoyo psicológico.  Esta información no tiene como fin reemplazar el consejo del médico. Asegúrese de hacerle al médico cualquier pregunta que tenga.  Document Released: 02/17/2005 Document Revised: 02/14/2017 Document Reviewed: 02/14/2017  Elsevier Interactive Patient Education © 2019 Elsevier Inc.

## 2018-09-08 ENCOUNTER — Other Ambulatory Visit: Payer: Self-pay

## 2018-09-08 ENCOUNTER — Ambulatory Visit (INDEPENDENT_AMBULATORY_CARE_PROVIDER_SITE_OTHER): Payer: Medicaid Other | Admitting: Obstetrics & Gynecology

## 2018-09-08 ENCOUNTER — Encounter: Payer: Self-pay | Admitting: Obstetrics & Gynecology

## 2018-09-08 DIAGNOSIS — Z3009 Encounter for other general counseling and advice on contraception: Secondary | ICD-10-CM

## 2018-09-08 DIAGNOSIS — O039 Complete or unspecified spontaneous abortion without complication: Secondary | ICD-10-CM

## 2018-09-08 MED ORDER — NORGESTIMATE-ETH ESTRADIOL 0.25-35 MG-MCG PO TABS: 1.0000 | ORAL_TABLET | Freq: Every day | ORAL | 11 refills | Status: DC

## 2018-09-08 NOTE — Patient Instructions (Signed)
Miscarriage  A miscarriage is the loss of an unborn baby (fetus) before the 20th week of pregnancy.  Follow these instructions at home:  Medicines    · Take over-the-counter and prescription medicines only as told by your doctor.  · If you were prescribed antibiotic medicine, take it as told by your doctor. Do not stop taking the antibiotic even if you start to feel better.  · Do not take NSAIDs unless your doctor says that this is safe for you. NSAIDs include aspirin and ibuprofen. These medicines can cause bleeding.  Activity  · Rest as directed. Ask your doctor what activities are safe for you.  · Have someone help you at home during this time.  General instructions  · Write down how many pads you use each day and how soaked they are.  · Watch the amount of tissue or clumps of blood (blood clots) that you pass from your vagina. Save any large amounts of tissue for your doctor.  · Do not use tampons, douche, or have sex until your doctor approves.  · To help you and your partner with the process of grieving, talk with your doctor or seek counseling.  · When you are ready, meet with your doctor to talk about steps you should take for your health. Also, talk with your doctor about steps to take to have a healthy pregnancy in the future.  · Keep all follow-up visits as told by your doctor. This is important.  Contact a doctor if:  · You have a fever or chills.  · You have vaginal discharge that smells bad.  · You have more bleeding.  Get help right away if:  · You have very bad cramps or pain in your back or belly.  · You pass clumps of blood that are walnut-sized or larger from your vagina.  · You pass tissue that is walnut-sized or larger from your vagina.  · You soak more than 1 regular pad in an hour.  · You get light-headed or weak.  · You faint (pass out).  · You have feelings of sadness that do not go away, or you have thoughts of hurting yourself.  Summary  · A miscarriage is the loss of an unborn baby before  the 20th week of pregnancy.  · Follow your doctor's instructions for home care. Keep all follow-up appointments.  · To help you and your partner with the process of grieving, talk with your doctor or seek counseling.  This information is not intended to replace advice given to you by your health care provider. Make sure you discuss any questions you have with your health care provider.  Document Released: 08/02/2011 Document Revised: 06/15/2016 Document Reviewed: 06/15/2016  Elsevier Interactive Patient Education © 2019 Elsevier Inc.

## 2018-09-08 NOTE — Progress Notes (Signed)
TELEHEALTH VIRTUAL GYNECOLOGY VISIT ENCOUNTER NOTE  I connected with Tiffany Morse on 09/08/18 at  8:55 AM EDT by telephone at home and verified that I am speaking with the correct person using two identifiers.   I discussed the limitations, risks, security and privacy concerns of performing an evaluation and management service by telephone and the availability of in person appointments. I also discussed with the patient that there may be a patient responsible charge related to this service. The patient expressed understanding and agreed to proceed.   History:  Tiffany Morse is a 38 y.o. [phone] female being evaluated today for recent diagnosis [facility]. She denies any abnormal vaginal discharge, bleeding, pelvic pain or other concerns.       Past Medical History:  Diagnosis Date   Medical history non-contributory    Past Surgical History:  Procedure Laterality Date   NO PAST SURGERIES     The following portions of the patient's history were reviewed and updated as appropriate: allergies, current medications, past family history, past medical history, past social history, past surgical history and problem list.   Health Maintenance:  Normal pap and negative HRHPV on 03/2016.  Normal mammogram on none.   Review of Systems:  Pertinent items noted in HPI and remainder of comprehensive ROS otherwise negative.  Physical Exam:   General:  Alert, oriented and cooperative.   Mental Status: Normal mood and affect perceived. Normal judgment and thought content.  Physical exam deferred due to nature of the encounter  Labs and Imaging Results for orders placed or performed during the hospital encounter of 08/31/18 (from the past 336 hour(s))  CBC   Collection Time: 08/31/18  3:43 PM  Result Value Ref Range   WBC 10.8 (H) 4.0 - 10.5 K/uL   RBC 3.78 (L) 3.87 - 5.11 MIL/uL   Hemoglobin 11.6 (L) 12.0 - 15.0 g/dL   HCT 45.434.1 (L) 09.836.0 - 11.946.0 %   MCV  90.2 80.0 - 100.0 fL   MCH 30.7 26.0 - 34.0 pg   MCHC 34.0 30.0 - 36.0 g/dL   RDW 14.712.5 82.911.5 - 56.215.5 %   Platelets 256 150 - 400 K/uL   nRBC 0.0 0.0 - 0.2 %   Dg Chest 1 View  Result Date: 08/24/2018 CLINICAL DATA:  Positive PPD. EXAM: CHEST  1 VIEW COMPARISON:  11/08/2012. FINDINGS: The heart size and mediastinal contours are within normal limits. Both lungs are clear. The visualized skeletal structures are unremarkable. IMPRESSION: No active disease. Electronically Signed   By: Elsie StainJohn T Curnes M.D.   On: 08/24/2018 14:19   [place] Ob Less Than 14 Weeks With Ob Transvaginal  Result Date: 08/31/2018 CLINICAL DATA:  Vaginal bleeding EXAM: OBSTETRIC <14 WK US AND TRANSVAGINAL OB US TECHNIQUE: Both transabdominal and transvaginal ultrasound examinations were performed for complete evaluation of the gestation as well as the maternal uterus, adnexal regions, and pelvic cul-de-sac. Transvaginal technique was performed to assess early pregnancy. COMPARISON:  None. FINDINGS: Intrauterine gestational sac: Not visualized Yolk sac:  Not visualized Embryo:  Not visualized Cardiac Activity: Not visualized Subchorionic hemorrhage:  None visualized. Maternal uterus/adnexae: Uterus is anteverted. Endometrium is thickened measuring 2.4 cm in thickness. The endometrium has a heterogeneous echotexture. Right ovary measures 3.3 x 1.8 x 1.7 cm. Left ovary measures 1.7 x 3.2 x 2.5 cm. There is no extrauterine pelvic or adnexal mass. No free pelvic fluid. IMPRESSION: No intrauterine gestation apparent. Thickened endometrium with inhomogeneous echotexture. Suspect recent spontaneous  abortion. Hemorrhage within the endometrium or possible retained products of conception cannot be excluded. Clinical assessment and correlation with beta HCG in this regard advised. Assuming positive beta HCG, beta HCG should be followed to 0 to exclude less likely possibility of ectopic gestation. Study otherwise unremarkable. Electronically Signed   By:  Bretta Bang III M.D.   On: 08/31/2018 16:37      Assessment and Plan:     1. Complete miscarriage Minimal symptoms of bleeding, no pain       I discussed the assessment and treatment plan with the patient. The patient was provided an opportunity to ask questions and all were answered. The patient agreed with the plan and demonstrated an understanding of the instructions.  She requested OCP and her prescription was reordered. I provided 10 minutes of non-face-to-face time during this encounter. Phone interpreter was assisting   Scheryl Darter, MD [facility] [employer], [facility]

## 2019-10-08 DIAGNOSIS — Z23 Encounter for immunization: Secondary | ICD-10-CM | POA: Diagnosis not present

## 2019-10-29 DIAGNOSIS — Z23 Encounter for immunization: Secondary | ICD-10-CM | POA: Diagnosis not present

## 2019-11-22 DIAGNOSIS — Z419 Encounter for procedure for purposes other than remedying health state, unspecified: Secondary | ICD-10-CM | POA: Diagnosis not present

## 2019-12-23 DIAGNOSIS — Z419 Encounter for procedure for purposes other than remedying health state, unspecified: Secondary | ICD-10-CM | POA: Diagnosis not present

## 2020-01-23 DIAGNOSIS — Z419 Encounter for procedure for purposes other than remedying health state, unspecified: Secondary | ICD-10-CM | POA: Diagnosis not present

## 2020-02-22 DIAGNOSIS — Z419 Encounter for procedure for purposes other than remedying health state, unspecified: Secondary | ICD-10-CM | POA: Diagnosis not present

## 2020-03-24 DIAGNOSIS — Z419 Encounter for procedure for purposes other than remedying health state, unspecified: Secondary | ICD-10-CM | POA: Diagnosis not present

## 2020-04-07 ENCOUNTER — Ambulatory Visit (INDEPENDENT_AMBULATORY_CARE_PROVIDER_SITE_OTHER): Payer: Medicaid Other | Admitting: Medical

## 2020-04-07 ENCOUNTER — Telehealth: Payer: Self-pay

## 2020-04-07 ENCOUNTER — Other Ambulatory Visit: Payer: Self-pay

## 2020-04-07 ENCOUNTER — Encounter: Payer: Self-pay | Admitting: Medical

## 2020-04-07 VITALS — BP 121/63 | HR 67 | Resp 18 | Ht 62.0 in | Wt 163.6 lb

## 2020-04-07 DIAGNOSIS — R300 Dysuria: Secondary | ICD-10-CM | POA: Diagnosis not present

## 2020-04-07 DIAGNOSIS — R102 Pelvic and perineal pain: Secondary | ICD-10-CM | POA: Diagnosis not present

## 2020-04-07 DIAGNOSIS — N926 Irregular menstruation, unspecified: Secondary | ICD-10-CM

## 2020-04-07 DIAGNOSIS — R1031 Right lower quadrant pain: Secondary | ICD-10-CM | POA: Diagnosis not present

## 2020-04-07 LAB — POC URINALSYSI DIPSTICK (AUTOMATED)
Bilirubin, UA: NEGATIVE
Blood, UA: POSITIVE
Glucose, UA: NEGATIVE
Ketones, UA: NEGATIVE
Leukocytes, UA: NEGATIVE
Nitrite, UA: NEGATIVE
Protein, UA: NEGATIVE
Spec Grav, UA: 1.015 (ref 1.010–1.025)
Urobilinogen, UA: 0.2 E.U./dL
pH, UA: 6 (ref 5.0–8.0)

## 2020-04-07 LAB — COMPREHENSIVE METABOLIC PANEL
ALT: 35 U/L (ref 0–35)
AST: 25 U/L (ref 0–37)
Albumin: 4.2 g/dL (ref 3.5–5.2)
Alkaline Phosphatase: 70 U/L (ref 39–117)
BUN: 9 mg/dL (ref 6–23)
CO2: 28 mEq/L (ref 19–32)
Calcium: 9.1 mg/dL (ref 8.4–10.5)
Chloride: 104 mEq/L (ref 96–112)
Creatinine, Ser: 0.64 mg/dL (ref 0.40–1.20)
GFR: 111.1 mL/min (ref >60.00)
Glucose, Bld: 122 mg/dL — ABNORMAL HIGH (ref 70–99)
Potassium: 4.3 mEq/L (ref 3.5–5.1)
Sodium: 138 mEq/L (ref 135–145)
Total Bilirubin: 0.7 mg/dL (ref 0.2–1.2)
Total Protein: 7.1 g/dL (ref 6.0–8.3)

## 2020-04-07 LAB — CBC WITH DIFFERENTIAL/PLATELET
Basophils Absolute: 0.1 10*3/uL (ref 0.0–0.1)
Basophils Relative: 1.3 % (ref 0.0–3.0)
Eosinophils Absolute: 0.3 10*3/uL (ref 0.0–0.7)
Eosinophils Relative: 5.6 % — ABNORMAL HIGH (ref 0.0–5.0)
HCT: 36.3 % (ref 36.0–46.0)
Hemoglobin: 12 g/dL (ref 12.0–15.0)
Lymphocytes Relative: 44.4 % (ref 12.0–46.0)
Lymphs Abs: 2.1 10*3/uL (ref 0.7–4.0)
MCHC: 33.2 g/dL (ref 30.0–36.0)
MCV: 85.1 fl (ref 78.0–100.0)
Monocytes Absolute: 0.3 10*3/uL (ref 0.1–1.0)
Monocytes Relative: 6.7 % (ref 3.0–12.0)
Neutro Abs: 2 10*3/uL (ref 1.4–7.7)
Neutrophils Relative %: 42 % — ABNORMAL LOW (ref 43.0–77.0)
Platelets: 277 10*3/uL (ref 150.0–400.0)
RBC: 4.26 Mil/uL (ref 3.87–5.11)
RDW: 15.7 % — ABNORMAL HIGH (ref 11.5–15.5)
WBC: 4.8 10*3/uL (ref 4.0–10.5)

## 2020-04-07 LAB — POCT URINE PREGNANCY: Preg Test, Ur: NEGATIVE

## 2020-04-07 MED ORDER — DICLOFENAC SODIUM 75 MG PO TBEC: 75.0000 mg | DELAYED_RELEASE_TABLET | Freq: Two times a day (BID) | ORAL | 0 refills | Status: DC

## 2020-04-07 NOTE — Telephone Encounter (Signed)
PA denied. Preferred alternatives: celecoxib (Celebrex) 50mg /100mg /200mg /400mg , ibuprofen 100mg /64mL suspension, ibuprofen 400mg /600mg /800mg , indomethacin 25mg /50mg  capsules, ketorolac 10mg  (if ketorolac given IM in office first), meloxicam 7.5mg /15mg , ec-naproxen delayed release 375mg /500mg , sulindac 150mg /200mg  tablets.

## 2020-04-07 NOTE — Telephone Encounter (Signed)
PA initiated via Covermymeds; KEY: [id number]. Awaiting determination.

## 2020-04-07 NOTE — Patient Instructions (Addendum)
You have rt lower quadrant area pain. Muscle pain, adnexal or source from appendix.  Some blood in urine so will get culture. Blood may be from upcoming menses.  Will get cbc today.   If normal cbc will likely proceed with [place] of ovary thru radiology or gynecologist.   If pain worsens such as fever, pain walking/causing rt lower quadrant pain,nausea or vomitng then get ct abd/pelvis to see appendix.  Follow up 2 weeks or as needed

## 2020-04-07 NOTE — Progress Notes (Signed)
Subjective:    Patient ID: Tiffany Morse, female    DOB: 10/01/1980, 39 y.o.   MRN: [id number]  HPI  Pt is in for first time.  Pt works for [employer]. No exercise apart from work. Pt states eat healthy. Non smoker. No alcohol. 3 children.   Pt last menses was Mar 07, 2020. No ocp used.  Pt in today reporting 3 months of rt side lower quadrant. Comes and goes. Pt will be present for about one week and then will stop. Uses tylenol.   Pt state pain is minimal presently.  Pt does not correlate pain with menstrual cycle.  On review of epic has has had one miscarriage.  Pt did have some recent pain on urinating 3 weeks ago. None since.   Pt states no relations for one year. States impossible.   Review of Systems  Constitutional: Negative for chills, fatigue and fever.  Respiratory: Negative for cough, chest tightness, shortness of breath and wheezing.   Cardiovascular: Negative for chest pain and palpitations.  Gastrointestinal: Negative for abdominal pain, blood in stool, nausea and vomiting.  Musculoskeletal: Negative for back pain.  Skin: Negative for rash.  Neurological: Negative for dizziness, seizures, light-headedness, numbness and headaches.  Psychiatric/Behavioral: Negative for behavioral problems, confusion and sleep disturbance. The patient is not nervous/anxious.      Past Medical History:  Diagnosis Date  . Medical history non-contributory      Social History   Socioeconomic History  . Marital status: Married    Spouse name: Not on file  . Number of children: Not on file  . Years of education: Not on file  . Highest education level: Not on file  Occupational History  . Not on file  Tobacco Use  . Smoking status: Never Smoker  . Smokeless tobacco: Never Used  Substance and Sexual Activity  . Alcohol use: No  . Drug use: No  . Sexual activity: Not Currently    Birth control/protection: None  Other Topics Concern  . Not on file  Social  History Narrative  . Not on file   Social Determinants of Health   Financial Resource Strain:   . Difficulty of Paying Living Expenses: Not on file  Food Insecurity:   . Worried About Programme researcher, broadcasting/film/video in the Last Year: Not on file  . Ran Out of Food in the Last Year: Not on file  Transportation Needs:   . Lack of Transportation (Medical): Not on file  . Lack of Transportation (Non-Medical): Not on file  Physical Activity:   . Days of Exercise per Week: Not on file  . Minutes of Exercise per Session: Not on file  Stress:   . Feeling of Stress : Not on file  Social Connections:   . Frequency of Communication with Friends and Family: Not on file  . Frequency of Social Gatherings with Friends and Family: Not on file  . Attends Religious Services: Not on file  . Active Member of Clubs or Organizations: Not on file  . Attends Banker Meetings: Not on file  . Marital Status: Not on file  Intimate Partner Violence:   . Fear of Current or Ex-Partner: Not on file  . Emotionally Abused: Not on file  . Physically Abused: Not on file  . Sexually Abused: Not on file    Past Surgical History:  Procedure Laterality Date  . NO PAST SURGERIES      History reviewed. No pertinent family history.  No Known Allergies  Current Outpatient Medications on File Prior to Visit  Medication Sig Dispense Refill  . norgestimate-ethinyl estradiol (ORTHO-CYCLEN) 0.25-35 MG-MCG tablet Take 1 tablet by mouth daily. (Patient not taking: Reported on 04/07/2020) 1 Package 11  . oxyCODONE-acetaminophen (PERCOCET) 5-325 MG tablet Take 1 tablet by mouth every 4 (four) hours as needed for up to 6 doses for severe pain. (Patient not taking: Reported on 04/07/2020) 6 tablet 0   No current facility-administered medications on file prior to visit.    BP 121/63   Pulse 67   Resp 18   Ht 5\' 2"  (1.575 m)   Wt 163 lb 9.6 oz (74.2 kg)   SpO2 99%   BMI 29.92 kg/m      Past Medical History:    Diagnosis Date  . Medical history non-contributory      Social History   Socioeconomic History  . Marital status: Married    Spouse name: Not on file  . Number of children: Not on file  . Years of education: Not on file  . Highest education level: Not on file  Occupational History  . Not on file  Tobacco Use  . Smoking status: Never Smoker  . Smokeless tobacco: Never Used  Substance and Sexual Activity  . Alcohol use: No  . Drug use: No  . Sexual activity: Not Currently    Birth control/protection: None  Other Topics Concern  . Not on file  Social History Narrative  . Not on file   Social Determinants of Health   Financial Resource Strain:   . Difficulty of Paying Living Expenses: Not on file  Food Insecurity:   . Worried About in the Last Year: Not on file  . Ran Out of Food in the Last Year: Not on file  Transportation Needs:   . Lack of Transportation (Medical): Not on file  . Lack of Transportation (Non-Medical): Not on file  Physical Activity:   . Days of Exercise per Week: Not on file  . Minutes of Exercise per Session: Not on file  Stress:   . Feeling of Stress : Not on file  Social Connections:   . Frequency of Communication with Friends and Family: Not on file  . Frequency of Social Gatherings with Friends and Family: Not on file  . Attends Religious Services: Not on file  . Active Member of Clubs or Organizations: Not on file  . Attends Programme researcher, broadcasting/film/video Meetings: Not on file  . Marital Status: Not on file  Intimate Partner Violence:   . Fear of Current or Ex-Partner: Not on file  . Emotionally Abused: Not on file  . Physically Abused: Not on file  . Sexually Abused: Not on file    Past Surgical History:  Procedure Laterality Date  . NO PAST SURGERIES      History reviewed. No pertinent family history.  No Known Allergies  Current Outpatient Medications on File Prior to Visit  Medication Sig Dispense Refill  .  norgestimate-ethinyl estradiol (ORTHO-CYCLEN) 0.25-35 MG-MCG tablet Take 1 tablet by mouth daily. (Patient not taking: Reported on 04/07/2020) 1 Package 11  . oxyCODONE-acetaminophen (PERCOCET) 5-325 MG tablet Take 1 tablet by mouth every 4 (four) hours as needed for up to 6 doses for severe pain. (Patient not taking: Reported on 04/07/2020) 6 tablet 0   No current facility-administered medications on file prior to visit.    BP 121/63   Pulse 67   Resp 18  Ht 5\' 2"  (1.575 m)   Wt 163 lb 9.6 oz (74.2 kg)   SpO2 99%   BMI 29.92 kg/m        Objective:   Physical Exam   General Appearance- Not in acute distress.  HEENT Eyes- Scleraeral/Conjuntiva-bilat- Not Yellow. Mouth & Throat- Normal.  Chest and Lung Exam Auscultation: Breath sounds:-Normal. Adventitious sounds:- No Adventitious sounds.  Cardiovascular Auscultation:Rythm - Regular. Heart Sounds -Normal heart sounds.  Abdomen Inspection:-Inspection Normal.  Palpation/Perucssion: Palpation and Percussion of the abdomen reveal faint rt quadarant and mid rt sided suprapubic tenderness., No Rebound tenderness, No rigidity(Guarding) and No Palpable abdominal masses.  Liver:-Normal.  Spleen:- Normal.   Back- no cva tenderness.     Assessment & Plan:  You have rt lower quadrant area pain. Muscle pain, adnexal or source from appendix.  Some blood in urine so will get culture. Blood may be from upcoming menses.  Will get cbc today.   If normal cbc will likely proceed with of ovary thru radiology or gynecologist.   If pain worsens such as fever, pain walking/causing rt lower quadrant pain,nausea or vomitng then get ct abd/pelvis to see appendix.  Follow up 2 weeks or as needed

## 2020-04-08 ENCOUNTER — Telehealth: Payer: Self-pay | Admitting: Medical

## 2020-04-08 ENCOUNTER — Other Ambulatory Visit (INDEPENDENT_AMBULATORY_CARE_PROVIDER_SITE_OTHER): Payer: Medicaid Other

## 2020-04-08 DIAGNOSIS — R102 Pelvic and perineal pain: Secondary | ICD-10-CM

## 2020-04-08 DIAGNOSIS — R739 Hyperglycemia, unspecified: Secondary | ICD-10-CM

## 2020-04-08 LAB — URINE CULTURE
MICRO NUMBER:: 11203342
Result:: NO GROWTH
SPECIMEN QUALITY:: ADEQUATE

## 2020-04-08 LAB — HEMOGLOBIN A1C: Hgb A1c MFr Bld: 5.8 % (ref 4.6–6.5)

## 2020-04-08 MED ORDER — NAPROXEN 500 MG PO TABS: 500.0000 mg | ORAL_TABLET | Freq: Two times a day (BID) | ORAL | 0 refills | Status: DC

## 2020-04-08 NOTE — Telephone Encounter (Signed)
Rx naproxen sent in. Will you call pharmacy and cancel the dicolfenac rx.

## 2020-04-08 NOTE — Telephone Encounter (Signed)
Can you call pt and have her take naprosyn. Go message that diclofenac was not covered. By list I got should cover naproxen.

## 2020-04-08 NOTE — Telephone Encounter (Signed)
Referral to gyn at [facility] placed.

## 2020-04-09 NOTE — Telephone Encounter (Signed)
Script cancelled. 

## 2020-04-09 NOTE — Telephone Encounter (Signed)
Patient advised of prescription change 

## 2020-04-09 NOTE — Telephone Encounter (Signed)
Called patient but no answer, lvm (in spanish) for patient to call back about her prescription.

## 2020-04-21 ENCOUNTER — Ambulatory Visit: Payer: Medicaid Other | Admitting: Medical

## 2020-04-23 ENCOUNTER — Ambulatory Visit (INDEPENDENT_AMBULATORY_CARE_PROVIDER_SITE_OTHER): Payer: Medicaid Other | Admitting: Medical

## 2020-04-23 ENCOUNTER — Other Ambulatory Visit: Payer: Self-pay

## 2020-04-23 VITALS — BP 107/63 | HR 77 | Temp 98.4°F | Resp 16 | Wt 165.0 lb

## 2020-04-23 DIAGNOSIS — R102 Pelvic and perineal pain: Secondary | ICD-10-CM | POA: Diagnosis not present

## 2020-04-23 DIAGNOSIS — Z419 Encounter for procedure for purposes other than remedying health state, unspecified: Secondary | ICD-10-CM | POA: Diagnosis not present

## 2020-04-23 NOTE — Progress Notes (Signed)
Subjective:    Patient ID: Tiffany Morse, female    DOB: Oct 21, 1980, 39 y.o.   MRN: [id number]  HPI Pt in for follow up.  She states has same level pain as before.   Last visit.  Pt in today reporting 3 months of rt side lower quadrant. Comes and goes. Pt will be present for about one week and then will stop. Uses tylenol.   Pt state pain is minimal presently.  Pt does not correlate pain with menstrual cycle.  On review of epic has has had one miscarriage.  Pt did have some recent pain on urinating 3 weeks ago. None since.   Pt states no relations for one year. States impossible.  Her last menses 04-10-20. She again states not sexually active.   Last time in her infection fighting cells were negative and negative pregnancy.  I put in [place] order. She may have gotten call but was working and could not answer. Also pt states gyn office did not call. I had put referral in thinking they could do [place] as well.   Last time rx'd naprosyn and it did help with pain.        Review of Systems  Constitutional: Negative for chills, fatigue and fever.  Respiratory: Negative for cough, chest tightness, shortness of breath and wheezing.   Cardiovascular: Negative for chest pain and palpitations.  Gastrointestinal: Negative for abdominal pain.  Genitourinary: Negative for difficulty urinating, flank pain, frequency, hematuria, pelvic pain and urgency.       Occasisional spotting.  Skin: Negative for rash.  Neurological: Negative for dizziness, seizures and light-headedness.  Hematological: Negative for adenopathy. Does not bruise/bleed easily.    Past Medical History:  Diagnosis Date  . Medical history non-contributory      Social History   Socioeconomic History  . Marital status: Married    Spouse name: Not on file  . Number of children: Not on file  . Years of education: Not on file  . Highest education level: Not on file  Occupational History  . Not on file    Tobacco Use  . Smoking status: Never Smoker  . Smokeless tobacco: Never Used  Substance and Sexual Activity  . Alcohol use: No  . Drug use: No  . Sexual activity: Not Currently    Birth control/protection: None  Other Topics Concern  . Not on file  Social History Narrative  . Not on file   Social Determinants of Health   Financial Resource Strain:   . Difficulty of Paying Living Expenses: Not on file  Food Insecurity:   . Worried About Programme researcher, broadcasting/film/video in the Last Year: Not on file  . Ran Out of Food in the Last Year: Not on file  Transportation Needs:   . Lack of Transportation (Medical): Not on file  . Lack of Transportation (Non-Medical): Not on file  Physical Activity:   . Days of Exercise per Week: Not on file  . Minutes of Exercise per Session: Not on file  Stress:   . Feeling of Stress : Not on file  Social Connections:   . Frequency of Communication with Friends and Family: Not on file  . Frequency of Social Gatherings with Friends and Family: Not on file  . Attends Religious Services: Not on file  . Active Member of Clubs or Organizations: Not on file  . Attends Banker Meetings: Not on file  . Marital Status: Not on file  Intimate Partner Violence:   .  Fear of Current or Ex-Partner: Not on file  . Emotionally Abused: Not on file  . Physically Abused: Not on file  . Sexually Abused: Not on file    Past Surgical History:  Procedure Laterality Date  . NO PAST SURGERIES      No family history on file.  No Known Allergies  Current Outpatient Medications on File Prior to Visit  Medication Sig Dispense Refill  . naproxen (NAPROSYN) 500 MG tablet Take 1 tablet (500 mg total) by mouth 2 (two) times daily with a meal. 20 tablet 0  . oxyCODONE-acetaminophen (PERCOCET) 5-325 MG tablet Take 1 tablet by mouth every 4 (four) hours as needed for up to 6 doses for severe pain. 6 tablet 0   No current facility-administered medications on file prior to  visit.    BP 107/63 (BP Location: Right Arm, Patient Position: Sitting, Cuff Size: Small)   Pulse 77   Temp 98.4 F (36.9 C) (Oral)   Resp 16   Wt 165 lb (74.8 kg)   SpO2 100%   BMI 30.18 kg/m       Objective:   Physical Exam  General Mental Status- Alert. General Appearance- Not in acute distress.   Skin General: Color- Normal Color. Moisture- Normal Moisture.  Neck Carotid Arteries- Normal color. Moisture- Normal Moisture. No carotid bruits. No JVD.  Chest and Lung Exam Auscultation: Breath Sounds:-Normal.  Cardiovascular Auscultation:Rythm- Regular. Murmurs & Other Heart Sounds:Auscultation of the heart reveals- No Murmurs.  Abdomen Inspection:-Inspeection Normal. Palpation/Percussion:Note:No mass. Palpation and Percussion of the abdomen reveal- minimal faint Tender, Non Distended + BS, no rebound or guarding.   Neurologic Cranial Nerve exam:- CN III-XII intact(No nystagmus), symmetric smile. Strength:- 5/5 equal and symmetric strength both upper and lower extremities.      Assessment & Plan:  You have persisting pain in rt side lower abdomen. Will get [place] to evaluate pelvic/ovary area.  I placed order please go down stairs to get scheduled. Can continue naprosyn twice daily.  Will follow result and if negative then would consider ct abd/pelvis to evaluate appendix area.  Currently I think ovarian cause more likely than appendix.  Follow up 7 days or as needed.  Not did not get urine pregnancy since test negative last time and again she denies any acitcity  Esperanza Richters, PA-C

## 2020-04-23 NOTE — Patient Instructions (Signed)
You have persisting pain in rt side lower abdomen. Will get [place] to evaluate pelvic/ovary area.  I placed order please go down stairs to get scheduled. Can continue naprosyn twice daily.  Will follow result and if negative then would consider ct abd/pelvis to evaluate appendix area.  Currently I think ovarian cause more likely than appendix.  Follow up 7 days or as needed.

## 2020-04-24 ENCOUNTER — Telehealth: Payer: Self-pay | Admitting: Medical

## 2020-04-24 ENCOUNTER — Ambulatory Visit (HOSPITAL_BASED_OUTPATIENT_CLINIC_OR_DEPARTMENT_OTHER)
Admission: RE | Admit: 2020-04-24 | Discharge: 2020-04-24 | Disposition: A | Discharge status: Home / Self Care | Payer: Medicaid Other | Source: Ambulatory Visit | Attending: Medical | Admitting: Medical

## 2020-04-24 DIAGNOSIS — R102 Pelvic and perineal pain: Secondary | ICD-10-CM | POA: Insufficient documentation

## 2020-04-24 DIAGNOSIS — N888 Other specified noninflammatory disorders of cervix uteri: Secondary | ICD-10-CM | POA: Diagnosis not present

## 2020-04-24 DIAGNOSIS — N83201 Unspecified ovarian cyst, right side: Secondary | ICD-10-CM | POA: Diagnosis not present

## 2020-04-24 NOTE — Telephone Encounter (Signed)
Pt has been referred to gyn at [facility]. She has not been called yet? I went ahead and did [place]. She has ovarian cyst and fibroid. Will you call them and follow up on delay??

## 2020-04-25 NOTE — Telephone Encounter (Signed)
It shows that they contacted her on  04/22/20  She just needs to cal him to schedule [phone]. I also ask that Annice Pih give her call to give her the info as well.

## 2020-04-25 NOTE — Telephone Encounter (Signed)
Ok. Thanks.

## 2020-05-24 DIAGNOSIS — Z419 Encounter for procedure for purposes other than remedying health state, unspecified: Secondary | ICD-10-CM | POA: Diagnosis not present

## 2020-05-28 ENCOUNTER — Encounter: Payer: Self-pay | Admitting: Obstetrics & Gynecology

## 2020-05-28 ENCOUNTER — Other Ambulatory Visit (HOSPITAL_COMMUNITY)
Admission: RE | Admit: 2020-05-28 | Discharge: 2020-05-28 | Disposition: A | Discharge status: Home / Self Care | Payer: Medicaid Other | Source: Ambulatory Visit | Attending: Obstetrics & Gynecology | Admitting: Obstetrics & Gynecology

## 2020-05-28 ENCOUNTER — Encounter: Payer: Self-pay | Admitting: General Practice

## 2020-05-28 ENCOUNTER — Ambulatory Visit (INDEPENDENT_AMBULATORY_CARE_PROVIDER_SITE_OTHER): Payer: Medicaid Other | Admitting: Obstetrics & Gynecology

## 2020-05-28 ENCOUNTER — Other Ambulatory Visit: Payer: Self-pay

## 2020-05-28 VITALS — BP 109/65 | HR 76 | Ht 62.0 in | Wt 168.1 lb

## 2020-05-28 DIAGNOSIS — Z124 Encounter for screening for malignant neoplasm of cervix: Secondary | ICD-10-CM | POA: Diagnosis not present

## 2020-05-28 DIAGNOSIS — N83201 Unspecified ovarian cyst, right side: Secondary | ICD-10-CM | POA: Diagnosis not present

## 2020-05-28 DIAGNOSIS — R102 Pelvic and perineal pain: Secondary | ICD-10-CM

## 2020-05-28 MED ORDER — LO LOESTRIN FE 1 MG-10 MCG / 10 MCG PO TABS: 1.0000 | ORAL_TABLET | Freq: Every day | ORAL | 0 refills | Status: DC

## 2020-05-28 NOTE — Progress Notes (Signed)
GYNECOLOGY  VISIT  CC:   Pelvic pain  HPI: 40 y.o. [phone] Married Other or two or more races female here for complaint if pelvic pain that has been intermittent over the past few months.  She was recently seen by Esperanza Richters, PA-C.  Naprosyn was started.  Ultrasound was ordered.  Results showed small fibroid measuring 1.7cm, corpus luteal cyst on right ovary, small nabothian cysts, and small amount of free fluid.  Results reviewed with pt.  Reports naprosyn helps.  Has regular cycles that last about 4-5 days.  Flow is normal.  Denies any urinary symptoms.  She does have some constipation issues.  OTC products for helping this discussed.  GYNECOLOGIC HISTORY: Patient's last menstrual period was 05/05/2020. Contraception: none  Patient Active Problem List   Diagnosis Date Noted  . 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 Vaginal itching 11/08/2012    Past Medical History:  Diagnosis Date  . Medical history non-contributory     Past Surgical History:  Procedure Laterality Date  . NO PAST SURGERIES      MEDS:   Current Outpatient Medications on File Prior to Visit  Medication Sig Dispense Refill  . naproxen (NAPROSYN) 500 MG tablet Take 1 tablet (500 mg total) by mouth 2 (two) times daily with a meal. (Patient not taking: Reported on 05/28/2020) 20 tablet 0   No current facility-administered medications on file prior to visit.    ALLERGIES: Patient has no known allergies.  History reviewed. No pertinent family history.  SH:  Married, non smoker  Review of Systems  Genitourinary: Positive for pelvic pain. Negative for urgency.    PHYSICAL EXAMINATION:    BP 109/65   Pulse 76   Ht 5\' 2"  (1.575 m)   Wt 168 lb 1.3 oz (76.2 kg)   LMP 05/05/2020   BMI 30.74 kg/m     General appearance: alert, cooperative and appears stated age Abdomen: soft, non-tender; bowel sounds normal; no masses,  no organomegaly Lymph:  no inguinal LAD noted  Pelvic:  External genitalia:  no lesions              Urethra:  normal appearing urethra with no masses, tenderness or lesions              Bartholins and Skenes: normal                 Vagina: normal appearing vagina with normal color and discharge, no lesions              Cervix: no lesions              Bimanual Exam:  Uterus:  normal size, contour, position, consistency, mobility, non-tender              Adnexa: no mass, fullness, tenderness  Chaperone, 05/07/2020, CMA, was present for exam.  Assessment/Plan: 1. Pelvic pain - causes discussed.  Will start trial of OCPs to see if can help with resolution of cyst or future cyst formation.  Risks discussed with pt in detail including DUB, DVT/PE, headache, nausea, increased BP.  Instruction sheet provided and reviewed with pt when to start and what to do if misses pill/pills. - Norethindrone-Ethinyl Estradiol-Fe Biphas (LO LOESTRIN FE) 1 MG-10 MCG / 10 MCG tablet; Take 1 tablet by mouth daily.  Dispense: 84 tablet; Refill: 0 - follow up 3 months for recheck  2. Cyst of right ovary - reviewed with pt this is physiologic but OCPs and  help prevent formation.    3. Cervical cancer screening - Cytology - PAP( [place])  4. Constipation - OTC colace/docusate sodium recommended.

## 2020-05-28 NOTE — Patient Instructions (Addendum)
Informacin sobre los anticonceptivos orales Oral Contraception Information Los anticonceptivos orales (ACO) son medicamentos que se utilizan para evitar el embarazo. Los ACO se toma por va oral y actan de las siguientes maneras:  Evitan que los ovarios liberen vulos.  Espesan la mucosidad en la parte inferior del tero (cuello uterino), lo cual impide que los espermatozoides ingresen en el tero.  Adelgazan el revestimiento del tero (endometrio), lo cual evita que un vulo fertilizado se adhiera al endometrio. Los ACO son muy efectivos cuando se toman exactamente como se indica. Sin embargo, no evitan las infeccin de transmisin sexual (ITS). La prctica del sexo seguro, como el uso de preservativos junto con un ACO, puede ayudar a prevenir las ITS. Antes de comenzar a tomar un ACO Antes de comenzar a tomar un ACO, debe hacerse un examen fsico, anlisis de sangre y una prueba de Papanicolaou. Sin embargo, no es necesario que se haga un examen plvico para que le receten un ACO. El mdico se asegurar de que usted sea una buena candidata para usar anticonceptivos orales. Los ACO no son una buena opcin para ciertas mujeres, incluidas las mujeres que fuman y que son mayores de 35aos de edad y las mujeres con antecedentes mdicos de hipertensin arterial, trombosis venosa profunda, embolia pulmonar, accidente cerebrovascular, enfermedad cardiovascular o enfermedad vascular perifrica. Converse con su mdico acerca de los posibles efectos secundarios de los ACO que podran recetarle. Cuando comienza el uso de un ACO, tenga en cuenta que su organismo puede tardar de 2 a 3 meses en adaptarse a los cambios en los niveles hormonales. Siga las indicaciones de su mdico acerca de cmo empezar a tomar el primer ciclo del ACO. En funcin de cundo comienza a tomar la pastilla, es posible que deba usar un mtodo anticonceptivo de respaldo, como preservativos, durante la primera semana. Asegrese de saber  qu hacer si alguna vez se olvida de tomar la pldora. Tipos de anticonceptivos orales  Los tipos ms comunes de anticonceptivos orales contienen estrgeno y progestina (progesterona sinttica), o solo progestina. La pldora combinada Este tipo de pldora contiene estrgeno y progestina. Las pldoras combinadas a menudo vienen en paquetes de 21, 28 o 91 pldoras. En cada paquete, las ltimas 7pldoras pueden no contener hormonas, lo que significa que puede dejar de tomar las pldoras durante 7das. El sangrado menstrual ocurre durante la semana en que no toma las pldoras o que toma las pldoras que no contienen hormonas. La minipldora Este tipo de pldora contiene progesterona solamente. Viene en paquetes de 28 pldoras. Las 28 pldoras contienen la hormona. Las pldoras deben tomarse todos los das. Es importante que las tome a la misma hora todos los das. Ventajas de los anticonceptivos orales  Proporcionan una anticoncepcin confiable y continua si se toman segn las indicaciones.  Pueden tratar o disminuir los sntomas de lo siguiente: ? Los clicos de los perodos menstruales. ? Ciclos menstruales o sangrado irregulares. ? Flujo menstrual abundante. ? Sangrado uterino anormal. ? Acn, segn el tipo de pldora. ? Sndrome ovrico poliqustico. ? Endometriosis. ? Anemia por carencia de hierro. ? Sntomas premenstruales, incluido el trastorno disfrico premenstrual.  Pueden reducir el riesgo de cncer endometrial y de ovario.  Pueden usarse como anticonceptivo de emergencia.  Evitan los embarazos extrauterinos (ectpicos) y las infecciones de las trompas de [place]opio. Factores que pueden hacer que los anticonceptivos orales sean menos efectivos Pueden ser menos efectivos si:  Se olvida de tomar la pldora todos los das a la misma hora. Esto es especialmente   importante al tomar la minipldora.  Tiene una enfermedad estomacal o intestinal que reduce la capacidad del cuerpo para  absorber la pldora.  Toma los ACO junto con otros medicamentos que los hacen menos efectivos, como antibiticos, ciertos medicamentos para el VIH y algunos medicamentos para las convulsiones.  Toma ACO que han vencido.  Se olvida de recomenzar el uso en el da 7 si usa el envase de 21das. Riesgos asociados con el uso de los anticonceptivos orales Los anticonceptivos orales pueden, en algunos casos, causar efectos secundarios, como los siguientes:  Dolor de cabeza.  Depresin.  Dificultad para dormir.  Nuseas y vmitos.  Dolor a la palpacin en las mamas.  Sangrado o manchado irregulares durante los primeros meses.  Meteorismo o retencin de lquidos.  Aumento de la presin arterial. Las pldoras combinadas tambin se asocian con un pequeo aumento en el riesgo de:  Cogulos de sangre.  Infarto de miocardio.  Accidente cerebrovascular. Resumen  Los anticonceptivos orales son medicamentos que se toman por va oral para evitar un embarazo. Son muy efectivos cuando se toman exactamente como se indica.  Los tipos ms comunes de anticonceptivos orales contienen estrgeno y progestina (progesterona sinttica), o solo progestina.  Antes de comenzar a tomar anticonceptivos orales, debe hacerse un examen fsico, anlisis de sangre y una prueba de Papanicolaou. El mdico se asegurar de que usted sea una buena candidata para usar anticonceptivos orales.  La pldora combinada puede venir en paquetes de 21, 28 o 91das. La minipldora contiene la hormona progesterona solamente y viene en paquetes de 28pldoras.  Los anticonceptivos orales a veces pueden causar efectos secundarios, como dolor de cabeza, nuseas, dolor a la palpacin en las mamas o sangrado irregular. Esta informacin no tiene como fin reemplazar el consejo del mdico. Asegrese de hacerle al mdico cualquier pregunta que tenga. Document Revised: 02/14/2017 Document Reviewed: 02/14/2017 Elsevier Patient Education   2020 Elsevier Inc.  

## 2020-05-28 NOTE — Progress Notes (Signed)
CAP interpreter Marta. 

## 2020-05-30 LAB — CYTOLOGY - PAP
Comment: NEGATIVE
Diagnosis: NEGATIVE
High risk HPV: NEGATIVE

## 2020-06-04 DIAGNOSIS — Z1152 Encounter for screening for COVID-19: Secondary | ICD-10-CM | POA: Diagnosis not present

## 2020-06-24 DIAGNOSIS — Z419 Encounter for procedure for purposes other than remedying health state, unspecified: Secondary | ICD-10-CM | POA: Diagnosis not present

## 2020-07-22 DIAGNOSIS — Z419 Encounter for procedure for purposes other than remedying health state, unspecified: Secondary | ICD-10-CM | POA: Diagnosis not present

## 2020-08-19 ENCOUNTER — Other Ambulatory Visit: Payer: Self-pay | Admitting: Obstetrics & Gynecology

## 2020-08-19 DIAGNOSIS — R102 Pelvic and perineal pain: Secondary | ICD-10-CM

## 2020-08-22 DIAGNOSIS — Z419 Encounter for procedure for purposes other than remedying health state, unspecified: Secondary | ICD-10-CM | POA: Diagnosis not present

## 2020-08-28 ENCOUNTER — Ambulatory Visit (HOSPITAL_BASED_OUTPATIENT_CLINIC_OR_DEPARTMENT_OTHER): Payer: Medicaid Other | Admitting: Obstetrics & Gynecology

## 2020-09-21 DIAGNOSIS — Z419 Encounter for procedure for purposes other than remedying health state, unspecified: Secondary | ICD-10-CM | POA: Diagnosis not present

## 2020-10-22 DIAGNOSIS — Z419 Encounter for procedure for purposes other than remedying health state, unspecified: Secondary | ICD-10-CM | POA: Diagnosis not present

## 2020-11-21 DIAGNOSIS — Z419 Encounter for procedure for purposes other than remedying health state, unspecified: Secondary | ICD-10-CM | POA: Diagnosis not present

## 2020-12-22 DIAGNOSIS — Z419 Encounter for procedure for purposes other than remedying health state, unspecified: Secondary | ICD-10-CM | POA: Diagnosis not present

## 2021-01-05 ENCOUNTER — Encounter: Payer: Self-pay | Admitting: General Practice

## 2021-01-05 ENCOUNTER — Encounter: Payer: Self-pay | Admitting: Obstetrics and Gynecology

## 2021-01-05 ENCOUNTER — Ambulatory Visit (INDEPENDENT_AMBULATORY_CARE_PROVIDER_SITE_OTHER): Payer: Medicaid Other | Admitting: Obstetrics and Gynecology

## 2021-01-05 ENCOUNTER — Other Ambulatory Visit (HOSPITAL_COMMUNITY)
Admission: RE | Admit: 2021-01-05 | Discharge: 2021-01-05 | Disposition: A | Discharge status: Home / Self Care | Payer: Medicaid Other | Source: Ambulatory Visit | Attending: Obstetrics and Gynecology | Admitting: Obstetrics and Gynecology

## 2021-01-05 ENCOUNTER — Other Ambulatory Visit: Payer: Self-pay

## 2021-01-05 VITALS — BP 115/66 | HR 81 | Wt 167.0 lb

## 2021-01-05 DIAGNOSIS — N930 Postcoital and contact bleeding: Secondary | ICD-10-CM | POA: Diagnosis not present

## 2021-01-05 DIAGNOSIS — Z8742 Personal history of other diseases of the female genital tract: Secondary | ICD-10-CM

## 2021-01-05 DIAGNOSIS — R1031 Right lower quadrant pain: Secondary | ICD-10-CM

## 2021-01-05 DIAGNOSIS — Z789 Other specified health status: Secondary | ICD-10-CM | POA: Diagnosis not present

## 2021-01-05 DIAGNOSIS — N888 Other specified noninflammatory disorders of cervix uteri: Secondary | ICD-10-CM | POA: Insufficient documentation

## 2021-01-05 DIAGNOSIS — G8929 Other chronic pain: Secondary | ICD-10-CM | POA: Diagnosis not present

## 2021-01-05 NOTE — Progress Notes (Signed)
Obstetrics and Gynecology Visit Return Patient Evaluation  Appointment Date: 01/05/2021  Primary Care Provider: Esperanza Richters  OBGYN Clinic: [facility]  Chief Complaint: f/u h/o ovarian cysts  History of Present Illness:  Tiffany Morse is a 40 y.o. P3 with above CC. Patient seen by her PCP in 04/2020 lower abdominal pain and u/s showed some b/l small ovarian cysts and she was set up to see Dr. Hyacinth Meeker. At that time she advised OCP trial to prevent new cysts and RTC after that.   Interval History: Since that time, she states that she finished the 90 days trial of the lo loestrin, but states it caused her to have AUB the entire time. She also says that her pelvic pain was unchanged on the trial of OCPs.   She states that the pain has been going on for about a year, is in the lower pelvic area near the RLQ, feels sharp, qday, increased with movement and helped with apap and heat, no change with period and that her periods actually don't have any pain. Her periods last for about 3-4 days (now that she's off the OCPs).   She also notes post coital bleeding that has been going on for the past few weeks. Pap cytology and HPV negative at her visit with Dr. Hyacinth Meeker in 05/2020.   Review of Systems: Pertinent items noted in HPI and remainder of comprehensive ROS otherwise negative.   Medications: None  Allergies: has No Known Allergies.  Physical Exam:  BP 115/66   Pulse 81   Wt 167 lb (75.8 kg)   LMP 12/05/2020   BMI 30.54 kg/m  Body mass index is 30.54 kg/m. General appearance: Well nourished, well developed female in no acute distress.  CV: normal s1 and s2, no MRGs Pulmonary: CTAB Abdomen: diffusely non tender to palpation, non distended, and no masses, hernias Neuro/Psych:  Normal mood and affect.    Pelvic exam:  EGBUS normal Vaginal vault Normal  cervix: within normal limits visually. Very friable. Q-tip for vaginal swab done and bleeding with  light touch. Silver nitrate applied  Radiology: Narrative & Impression  CLINICAL DATA:  Right adnexal pain for 3 months   EXAM: TRANSABDOMINAL AND TRANSVAGINAL ULTRASOUND OF PELVIS   TECHNIQUE: Both transabdominal and transvaginal ultrasound examinations of the pelvis were performed. Transabdominal technique was performed for global imaging of the pelvis including uterus, ovaries, adnexal regions, and pelvic cul-de-sac. It was necessary to proceed with endovaginal exam following the transabdominal exam to visualize the bilateral ovaries.   COMPARISON:  Obstetrical ultrasound 08/31/2018   FINDINGS: Uterus   Measurements: 8.6 x 4.4 x 5.4 cm = volume: 108 mL. Focal hypoechoic subserosal structure along the right anterior uterine fundus measuring 1.7 x 1.1 x 1.7 cm most compatible with a small subserosal fibroid. No other concerning uterine mass or visible fibroids. Few benign incidental nabothian cysts at the cervix.   Endometrium   Thickness: 8.2 mm, non thickened.  No focal abnormality visualized.   Right ovary   Measurements: 4.0 x 1.6 x 2.5 cm = volume: 8.5 mL. There is a thick-walled, crenulated cystic appearing structure in the right ovary measuring 1.8 x 1.1 x 1.4 cm in size with peripheral color vascularity which is compatible with a corpus luteum, a normal physiologic finding. Additional normal follicles are seen.   Left ovary   Measurements: 3.8 x 2.8 x 2.7 cm = volume: 14.7 mL. Dominant anechoic follicle seen in the left ovary measuring up to 2.5  cm in size. Additional normal follicles. No concerning adnexal lesion.   Other findings   Small volume anechoic free fluid.   IMPRESSION: 1. Hypoechoic structure, likely subserosal fibroid, along the right anterior uterine fundus measuring up to 1.7 cm. 2. Cystic lesion with peripheral flow in the right ovary most compatible with a corpus luteum cyst in the right ovary, a normal physiologic finding. No further  follow-up is typically warranted. No followup imaging recommended. Note: This recommendation does not apply to premenarchal patients or to those with increased risk (genetic, family history, elevated tumor markers or other high-risk factors) of ovarian cancer. Reference: Radiology 2019 Nov; 293(2):359-371. Additional imaging would be based upon clinical discretion. 3. Few nabothian cysts, benign incidental. 4. Small volume anechoic free fluid may be physiologic finding in a reproductive age female. 5. Otherwise unremarkable pelvic ultrasound.     Electronically Signed   By: Kreg Shropshire M.D.   On: 04/24/2020 18:18   Assessment: pt stable  Plan:  1. Language barrier Interpreter used  2. History of ovarian cyst I told her that I don't believe her s/s from a GYN source given the unremarkable u/s findings and no change on the OCPs but will check an early cycle pelvic u/s. I told her that if u/s is completely negative and swab today is negative then s/s are from another source that can be investigated with her PCP - US PELVIS TRANSVAGINAL NON-OB (TV ONLY); Future  3. Chronic RLQ pain See above - US PELVIS TRANSVAGINAL NON-OB (TV ONLY); Future  4. PCB (post coital bleeding) From friable cx. F/u swabs. I told her to do pelvic rest for a few days to let cx heal.  - Cervicovaginal ancillary only( [place]) - US PELVIS TRANSVAGINAL NON-OB (TV ONLY); Future  5. Friable cervix   RTC: PRN  Cornelia Copa MD Attending [facility])

## 2021-01-05 NOTE — Progress Notes (Signed)
CAP interpreter Tiffany Morse.

## 2021-01-06 LAB — CERVICOVAGINAL ANCILLARY ONLY
Bacterial Vaginitis (gardnerella): NEGATIVE
Candida Glabrata: NEGATIVE
Candida Vaginitis: NEGATIVE
Chlamydia: NEGATIVE
Comment: NEGATIVE
Comment: NEGATIVE
Comment: NEGATIVE
Comment: NEGATIVE
Comment: NEGATIVE
Comment: NORMAL
Neisseria Gonorrhea: NEGATIVE
Trichomonas: NEGATIVE

## 2021-01-12 ENCOUNTER — Other Ambulatory Visit (HOSPITAL_BASED_OUTPATIENT_CLINIC_OR_DEPARTMENT_OTHER): Payer: Medicaid Other

## 2021-01-19 ENCOUNTER — Other Ambulatory Visit: Payer: Self-pay

## 2021-01-19 ENCOUNTER — Ambulatory Visit (HOSPITAL_BASED_OUTPATIENT_CLINIC_OR_DEPARTMENT_OTHER)
Admission: RE | Admit: 2021-01-19 | Discharge: 2021-01-19 | Disposition: A | Discharge status: Home / Self Care | Payer: Medicaid Other | Source: Ambulatory Visit | Attending: Obstetrics and Gynecology | Admitting: Obstetrics and Gynecology

## 2021-01-19 DIAGNOSIS — D251 Intramural leiomyoma of uterus: Secondary | ICD-10-CM | POA: Diagnosis not present

## 2021-01-19 DIAGNOSIS — R1031 Right lower quadrant pain: Secondary | ICD-10-CM | POA: Insufficient documentation

## 2021-01-19 DIAGNOSIS — G8929 Other chronic pain: Secondary | ICD-10-CM | POA: Insufficient documentation

## 2021-01-19 DIAGNOSIS — Z8742 Personal history of other diseases of the female genital tract: Secondary | ICD-10-CM | POA: Diagnosis not present

## 2021-01-19 DIAGNOSIS — N930 Postcoital and contact bleeding: Secondary | ICD-10-CM | POA: Insufficient documentation

## 2021-01-22 DIAGNOSIS — Z419 Encounter for procedure for purposes other than remedying health state, unspecified: Secondary | ICD-10-CM | POA: Diagnosis not present

## 2021-01-30 ENCOUNTER — Ambulatory Visit: Payer: Medicaid Other | Admitting: Obstetrics & Gynecology

## 2021-02-04 IMAGING — US OBSTETRIC <14 WK US AND TRANSVAGINAL OB US
1 series · 15 of 28 positions shown · non-contrast
Comparison: None.

CLINICAL DATA: Vaginal bleeding

EXAM:
OBSTETRIC <14 WK US AND TRANSVAGINAL OB US
TECHNIQUE: Both transabdominal and transvaginal ultrasound examinations were
performed for complete evaluation of the gestation as well as the
maternal uterus, adnexal regions, and pelvic cul-de-sac.
Transvaginal technique was performed to assess early pregnancy.

[Series 1: obstetric <14 wk us and transvaginal ob us · 15 of 55 slices shown]
[im 1/55]
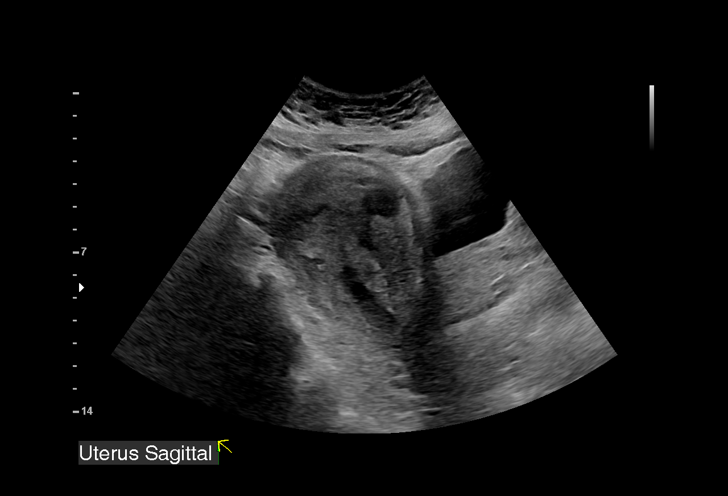
[im 5/55]
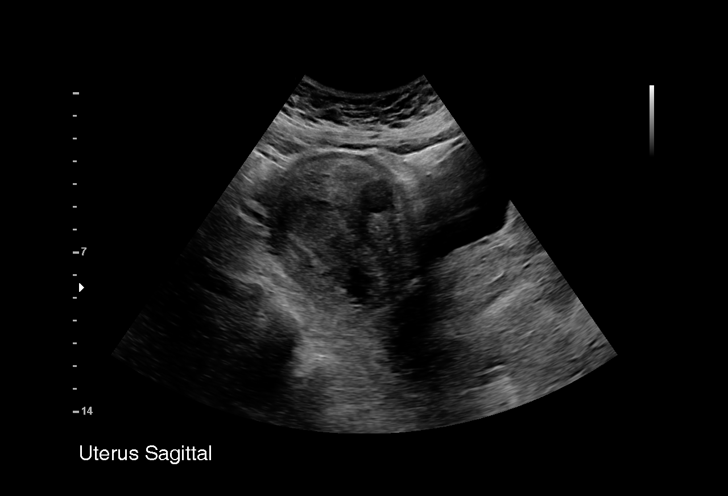
[im 9/55]
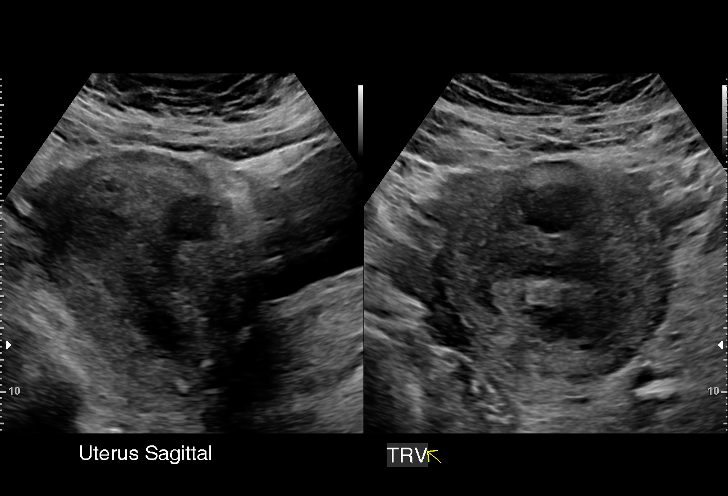
[im 13/55]
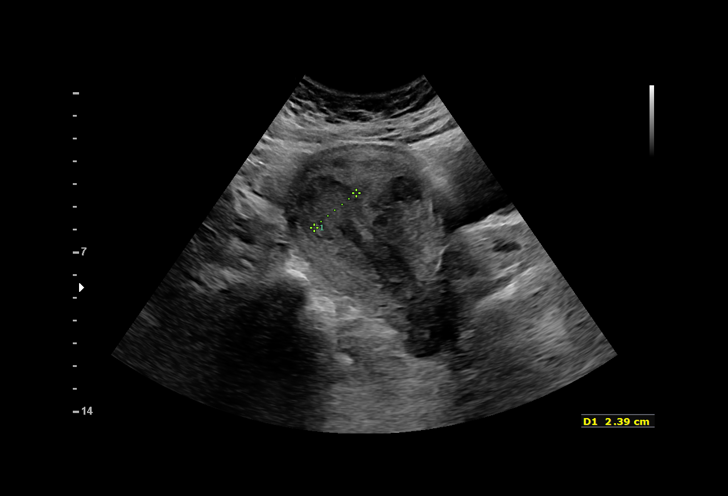
[im 17/55]
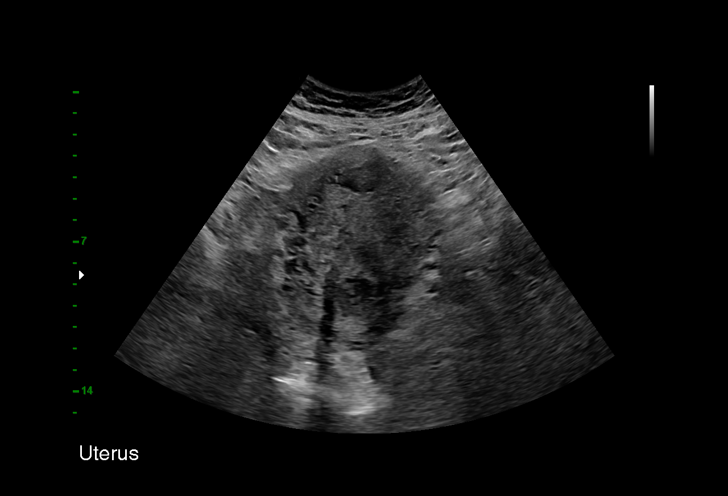
[im 21/55]
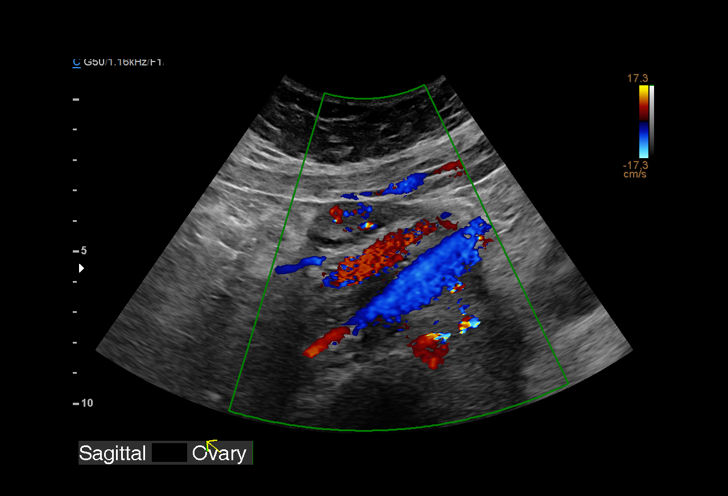
[im 25/55]
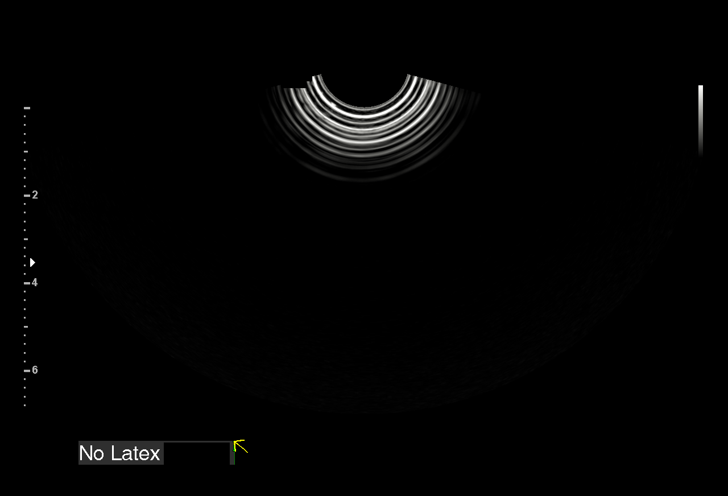
[im 29/55]
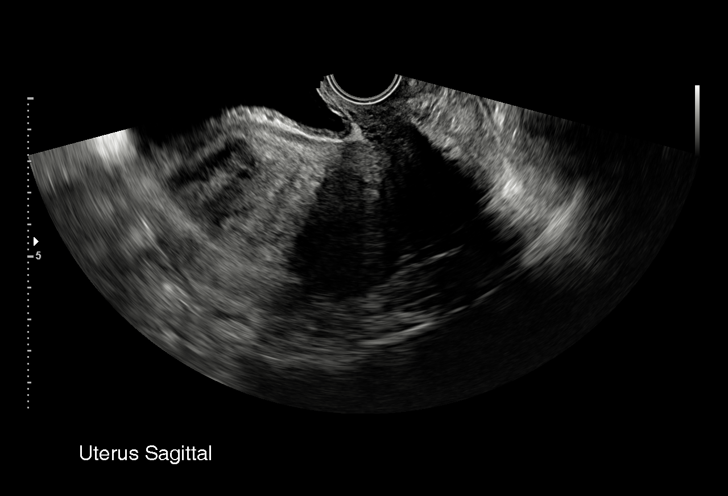
[im 31/55]
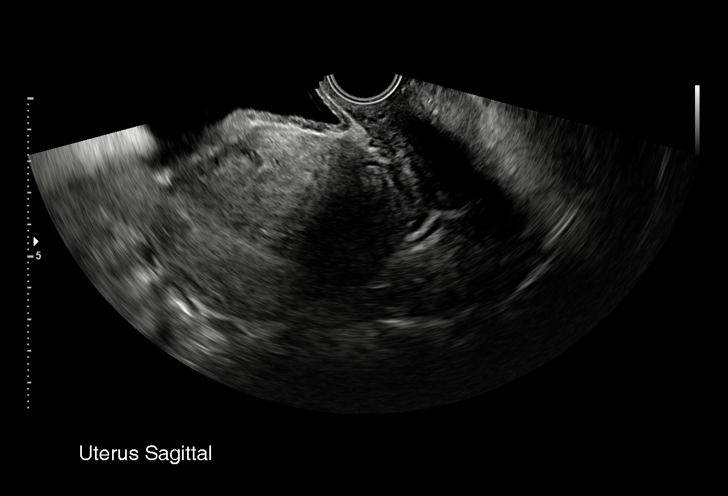
[im 35/55]
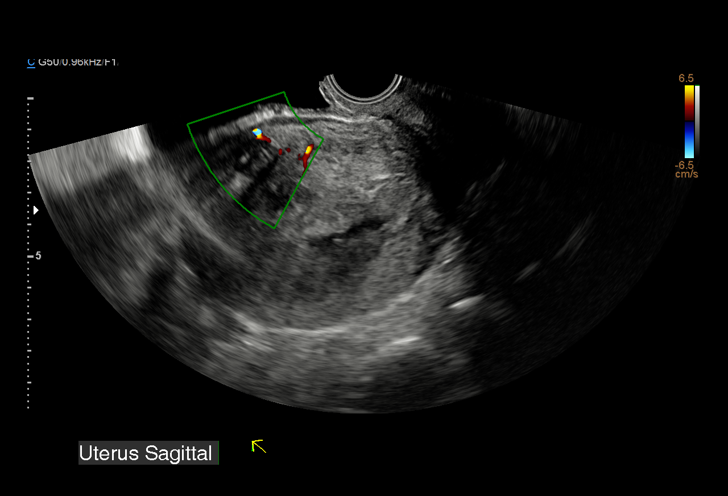
[im 39/55]
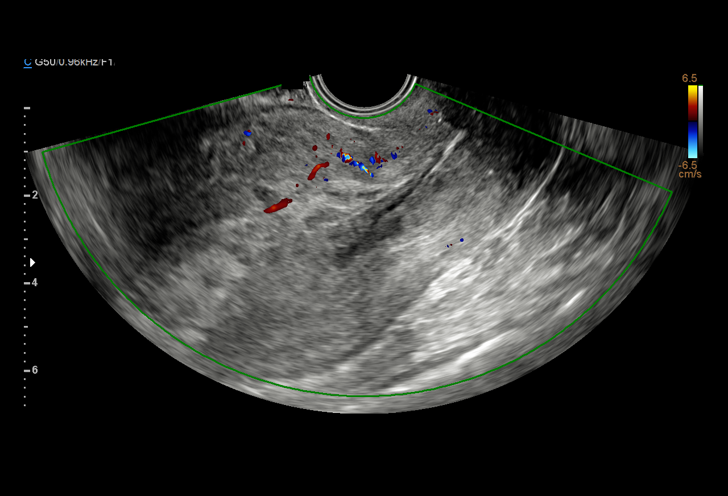
[im 43/55]
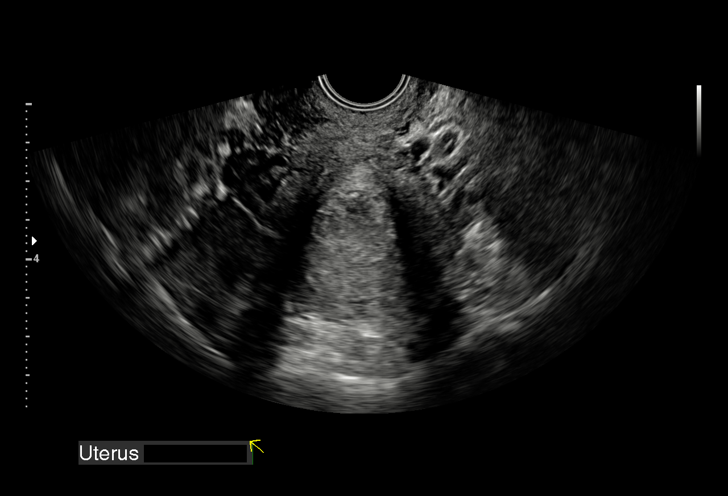
[im 47/55]
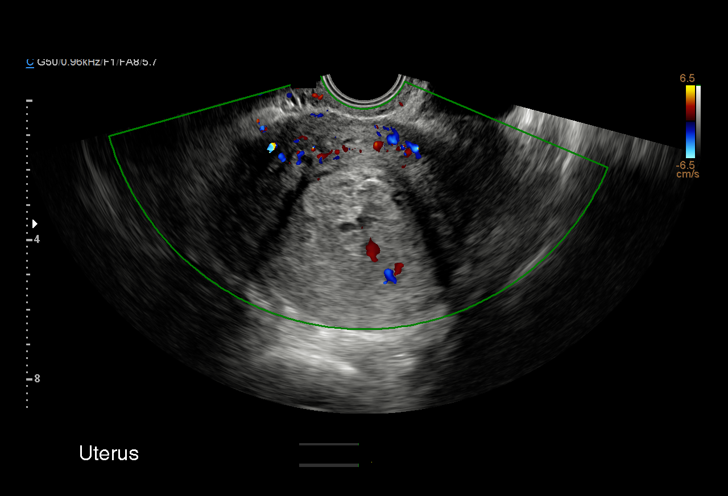
[im 51/55]
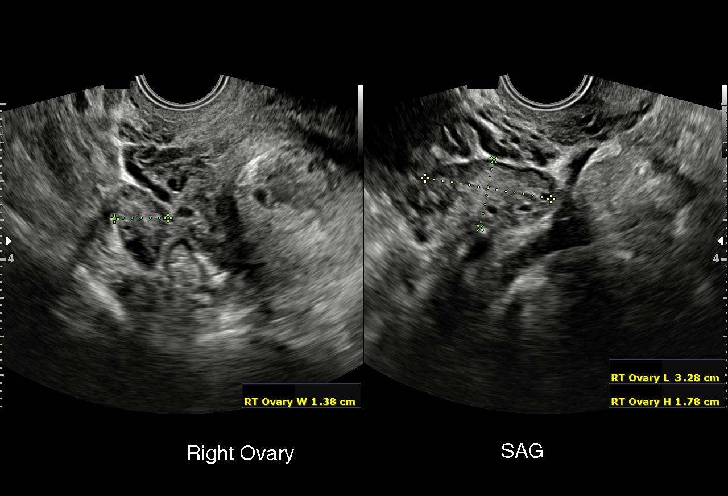
[im 55/55]
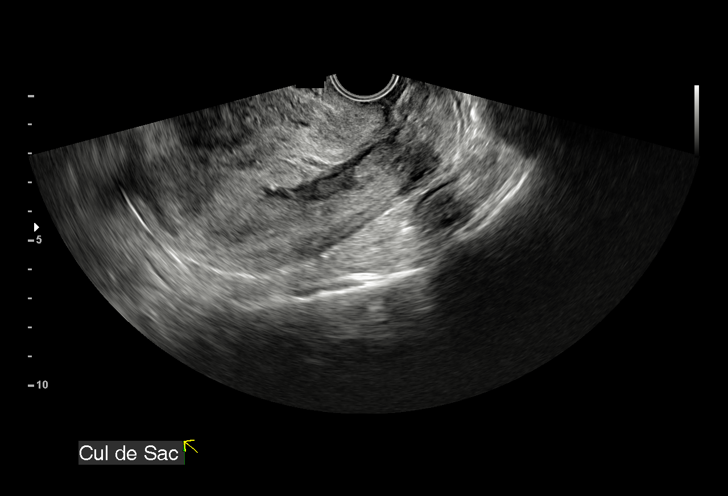

[15 of 28 positions shown; findings below may reference images not displayed]

FINDINGS: Intrauterine gestational sac: Not visualized

Yolk sac:  Not visualized

Embryo:  Not visualized

Cardiac Activity: Not visualized

Subchorionic hemorrhage:  None visualized.

Maternal uterus/adnexae: Uterus is anteverted. Endometrium is
thickened measuring 2.4 cm in thickness. The endometrium has a
heterogeneous echotexture. Right ovary measures 3.3 x 1.8 x 1.7 cm.
Left ovary measures 1.7 x 3.2 x 2.5 cm. There is no extrauterine
pelvic or adnexal mass. No free pelvic fluid.
IMPRESSION: No intrauterine gestation apparent. Thickened endometrium with
inhomogeneous echotexture. Suspect recent spontaneous abortion.
Hemorrhage within the endometrium or possible retained products of
conception cannot be excluded. Clinical assessment and correlation
with beta HCG in this regard advised. Assuming positive beta HCG,
beta HCG should be followed to 0 to exclude less likely possibility
of ectopic gestation. Study otherwise unremarkable.

## 2021-02-21 DIAGNOSIS — Z419 Encounter for procedure for purposes other than remedying health state, unspecified: Secondary | ICD-10-CM | POA: Diagnosis not present

## 2021-03-24 DIAGNOSIS — Z419 Encounter for procedure for purposes other than remedying health state, unspecified: Secondary | ICD-10-CM | POA: Diagnosis not present

## 2021-04-23 DIAGNOSIS — Z419 Encounter for procedure for purposes other than remedying health state, unspecified: Secondary | ICD-10-CM | POA: Diagnosis not present

## 2021-05-12 ENCOUNTER — Ambulatory Visit (INDEPENDENT_AMBULATORY_CARE_PROVIDER_SITE_OTHER): Payer: Medicaid Other | Admitting: Medical

## 2021-05-12 ENCOUNTER — Other Ambulatory Visit (HOSPITAL_BASED_OUTPATIENT_CLINIC_OR_DEPARTMENT_OTHER): Payer: Self-pay

## 2021-05-12 VITALS — BP 123/49 | HR 75 | Temp 98.2°F | Resp 18 | Ht 62.0 in | Wt 170.0 lb

## 2021-05-12 DIAGNOSIS — R300 Dysuria: Secondary | ICD-10-CM | POA: Diagnosis not present

## 2021-05-12 LAB — POCT URINALYSIS DIPSTICK
Bilirubin, UA: NEGATIVE
Blood, UA: NEGATIVE
Glucose, UA: NEGATIVE
Ketones, UA: NEGATIVE
Leukocytes, UA: NEGATIVE
Nitrite, UA: NEGATIVE
Protein, UA: NEGATIVE
Spec Grav, UA: <=1.005 — AB (ref 1.010–1.025)
Urobilinogen, UA: 0.2 E.U./dL
pH, UA: 6 (ref 5.0–8.0)

## 2021-05-12 MED ORDER — SULFAMETHOXAZOLE-TRIMETHOPRIM 800-160 MG PO TABS
1.0000 | ORAL_TABLET | Freq: Two times a day (BID) | ORAL | 0 refills | Status: DC
  Filled 2021-05-12: qty 14, 7d supply, fill #0

## 2021-05-12 NOTE — Patient Instructions (Addendum)
Your appear to have a urinary tract infection. I am prescribing  bactrim ds antibiotic for the probable infection. Hydrate well. I am sending out a urine culture. During the interim if your signs and symptoms worsen rather than improving please notify us. We will notify your when the culture results are back.  Bactrim ds antibiotic also has good coverage for skin infection in upper suprapubic area. If area expands or becomes soft in middle abscess like then you may need incision and drainage. Presently area appears to be just skin infection.  Follow up in 7 days or as needed.

## 2021-05-12 NOTE — Progress Notes (Signed)
Subjective:    Patient ID: Tiffany Morse, female    DOB: 08-24-1980, 40 y.o.   MRN: [id number]  HPI Pt in today reporting urinary symptoms x 1 week.  Dysuria- yes Frequent urination-yes/mild. Hesitancy-no Suprapubic pressure-mild Fever-no chills-no  Nausea-no Vomiting-no CVA pain-no History of UTI-occaional. Gross hematuria- no  Lmp- 04-28-2021.   One small red area below pubic area. That has present for 3 days.     Review of Systems  Constitutional:  Negative for chills, fatigue and fever.  Respiratory:  Negative for cough, choking, shortness of breath and wheezing.   Cardiovascular:  Negative for chest pain and palpitations.  Gastrointestinal:  Negative for abdominal distention, abdominal pain, blood in stool, constipation and diarrhea.  Genitourinary:  Positive for dysuria, frequency and urgency. Negative for difficulty urinating, hematuria and vaginal bleeding.  Musculoskeletal:  Negative for back pain.  Skin:  Positive for rash.  Neurological:  Negative for dizziness, numbness and headaches.  Hematological:  Negative for adenopathy.    Past Medical History:  Diagnosis Date   Medical history non-contributory      Social History   Socioeconomic History   Marital status: Married    Spouse name: Not on file   Number of children: Not on file   Years of education: Not on file   Highest education level: Not on file  Occupational History   Not on file  Tobacco Use   Smoking status: Never   Smokeless tobacco: Never  Substance and Sexual Activity   Alcohol use: No   Drug use: No   Sexual activity: Not Currently    Birth control/protection: None  Other Topics Concern   Not on file  Social History Narrative   Not on file   Social Determinants of Health   Financial Resource Strain: Not on file  Food Insecurity: Not on file  Transportation Needs: Not on file  Physical Activity: Not on file  Stress: Not on file  Social Connections: Not on file   Intimate Partner Violence: Not on file    Past Surgical History:  Procedure Laterality Date   NO PAST SURGERIES      No family history on file.  No Known Allergies  No current outpatient medications on file prior to visit.   No current facility-administered medications on file prior to visit.    BP (!) 123/49    Pulse 75    Temp 98.2 F (36.8 C)    Resp 18    Ht 5\' 2"  (1.575 m)    Wt 170 lb (77.1 kg)    SpO2 99%    BMI 31.09 kg/m       Objective:   Physical Exam  General- No acute distress. Pleasant patient. Neck- Full range of motion, no jvd Lungs- Clear, even and unlabored. Heart- regular rate and rhythm. Neurologic- CNII- XII grossly intact.  Skin- in upper portion of suprapubic area region of 2 cm faint pink redness. With induration but no fluctance. Abd- faint suprapubic tenderness. Back- no cva tenderness.      Assessment & Plan:   Patient Instructions  Your appear to have a urinary tract infection. I am prescribing  bactrim ds antibiotic for the probable infection. Hydrate well. I am sending out a urine culture. During the interim if your signs and symptoms worsen rather than improving please notify us. We will notify your when the culture results are back.  Bactrim ds antibiotic also has good coverage for skin infection in upper suprapubic area. If  area expands or becomes soft in middle abscess like then you may need incision and drainage. Presently area appears to be just skin infection.  Follow up in 7 days or as needed.     Esperanza Richters, PA-C

## 2021-05-13 LAB — URINE CULTURE
MICRO NUMBER:: 12780178
Result:: NO GROWTH
SPECIMEN QUALITY:: ADEQUATE

## 2021-05-14 ENCOUNTER — Telehealth: Payer: Self-pay | Admitting: Medical

## 2021-05-14 DIAGNOSIS — Z113 Encounter for screening for infections with a predominantly sexual mode of transmission: Secondary | ICD-10-CM

## 2021-05-14 DIAGNOSIS — R300 Dysuria: Secondary | ICD-10-CM

## 2021-05-14 NOTE — Telephone Encounter (Signed)
Chart opened to rx med, order lab, review chart, respond to my chart message or send message to staff member  

## 2021-05-14 NOTE — Telephone Encounter (Signed)
Opened to review and place urine ancillary orders.

## 2021-05-15 ENCOUNTER — Other Ambulatory Visit: Payer: Medicaid Other

## 2021-05-15 ENCOUNTER — Other Ambulatory Visit (HOSPITAL_COMMUNITY)
Admission: RE | Admit: 2021-05-15 | Discharge: 2021-05-15 | Disposition: A | Discharge status: Home / Self Care | Payer: Medicaid Other | Source: Ambulatory Visit | Attending: Medical | Admitting: Medical

## 2021-05-15 DIAGNOSIS — R300 Dysuria: Secondary | ICD-10-CM | POA: Diagnosis not present

## 2021-05-15 DIAGNOSIS — Z113 Encounter for screening for infections with a predominantly sexual mode of transmission: Secondary | ICD-10-CM | POA: Diagnosis not present

## 2021-05-22 LAB — URINE CYTOLOGY ANCILLARY ONLY
Bacterial Vaginitis-Urine: NEGATIVE
Candida Urine: NEGATIVE
Chlamydia: NEGATIVE
Comment: NEGATIVE
Comment: NEGATIVE
Comment: NORMAL
Neisseria Gonorrhea: NEGATIVE
Trichomonas: NEGATIVE

## 2021-05-24 DIAGNOSIS — Z419 Encounter for procedure for purposes other than remedying health state, unspecified: Secondary | ICD-10-CM | POA: Diagnosis not present

## 2021-06-24 DIAGNOSIS — Z419 Encounter for procedure for purposes other than remedying health state, unspecified: Secondary | ICD-10-CM | POA: Diagnosis not present

## 2021-07-22 DIAGNOSIS — Z419 Encounter for procedure for purposes other than remedying health state, unspecified: Secondary | ICD-10-CM | POA: Diagnosis not present

## 2021-08-22 DIAGNOSIS — Z419 Encounter for procedure for purposes other than remedying health state, unspecified: Secondary | ICD-10-CM | POA: Diagnosis not present

## 2021-09-21 DIAGNOSIS — Z419 Encounter for procedure for purposes other than remedying health state, unspecified: Secondary | ICD-10-CM | POA: Diagnosis not present

## 2021-09-28 DIAGNOSIS — Z0289 Encounter for other administrative examinations: Secondary | ICD-10-CM | POA: Diagnosis not present

## 2021-10-22 DIAGNOSIS — Z419 Encounter for procedure for purposes other than remedying health state, unspecified: Secondary | ICD-10-CM | POA: Diagnosis not present

## 2021-11-21 DIAGNOSIS — Z419 Encounter for procedure for purposes other than remedying health state, unspecified: Secondary | ICD-10-CM | POA: Diagnosis not present

## 2021-12-22 DIAGNOSIS — Z419 Encounter for procedure for purposes other than remedying health state, unspecified: Secondary | ICD-10-CM | POA: Diagnosis not present

## 2021-12-28 ENCOUNTER — Ambulatory Visit (INDEPENDENT_AMBULATORY_CARE_PROVIDER_SITE_OTHER): Payer: Medicaid Other | Admitting: Family Medicine

## 2021-12-28 ENCOUNTER — Encounter: Payer: Self-pay | Admitting: Family Medicine

## 2021-12-28 ENCOUNTER — Other Ambulatory Visit (HOSPITAL_BASED_OUTPATIENT_CLINIC_OR_DEPARTMENT_OTHER): Payer: Self-pay

## 2021-12-28 VITALS — BP 104/62 | HR 74 | Ht 62.0 in | Wt 173.4 lb

## 2021-12-28 DIAGNOSIS — K644 Residual hemorrhoidal skin tags: Secondary | ICD-10-CM | POA: Diagnosis not present

## 2021-12-28 MED ORDER — PROCTOFOAM HC 1-1 % EX FOAM
1.0000 | Freq: Two times a day (BID) | CUTANEOUS | 1 refills | Status: DC
Start: 2021-12-28 — End: 2023-08-12
  Filled 2021-12-28: qty 10, 20d supply, fill #0

## 2021-12-28 NOTE — Patient Instructions (Addendum)
Try the Proctofoam topical cream prescribed. Avoid prolonged sitting.  Avoid straining with bowel movements.  Increase high fiber food intake.   Participate in regular exercise. Bulk forming agents: Metamucil, Citrucel, Fibercon Stool softeners: Colace Irritation/Itching relief: OTC Anusol, Preparation H, hydrocortisone suppositories, warm sitz baths, hemorrhoid tucks (witch hazel pads)  Please contact office for follow-up if symptoms do not improve or worsen. Seek emergency care if symptoms become severe.

## 2021-12-28 NOTE — Progress Notes (Signed)
   Acute Office Visit  Subjective:     Patient ID: Tiffany Morse, female    DOB: 01/28/1981, 41 y.o.   MRN: [id number]  CC: hemorrhoids   HPI Patient is in today for external hemorrhoids.   Interpreter [phone]  HEMORRHOIDS Duration: 3-4 days Anal fullness: yes Perianal itching/irritation: no Perianal pain: yes, currently 8/10, fluctuates Bright red rectal bleeding: no Amount of blood:  none Frequency: occasional Constipation: no Hard stools: no Chronic straining/valsava: no Alleviating factors:  Aggravating factors: Status: better and fluctuating Treatments attempted:  tylenol, warm water rinse   Previous hemorrhoids: yes  Denies constipation, bleeding, diarrhea, abdominal pain, vaginal or other GI/GU symptoms     ROS All review of systems negative except what is listed in the HPI      Objective:    BP 104/62   Pulse 74   Ht 5\' 2"  (1.575 m)   Wt 173 lb 6.4 oz (78.7 kg)   BMI 31.72 kg/m    Physical Exam Vitals reviewed. Exam conducted with a chaperone present.  Constitutional:      Appearance: Normal appearance.  Genitourinary:    Rectum: External hemorrhoid present. No mass, tenderness or anal fissure. Normal anal tone.  Skin:    General: Skin is warm and dry.  Neurological:     General: No focal deficit present.     Mental Status: She is alert and oriented to person, place, and time. Mental status is at baseline.  Psychiatric:        Mood and Affect: Mood normal.        Behavior: Behavior normal.        Thought Content: Thought content normal.        Judgment: Judgment normal.        No results found for any visits on 12/28/21.      Assessment & Plan:   Problem List Items Addressed This Visit   None Visit Diagnoses     External hemorrhoids    -  Primary Try the Proctofoam topical cream prescribed. Avoid prolonged sitting.  Avoid straining with bowel movements.  Increase high fiber food intake.   Participate in regular  exercise. Bulk forming agents: Metamucil, Citrucel, Fibercon Stool softeners: Colace Irritation/Itching relief: OTC Anusol, Preparation H, hydrocortisone suppositories, warm sitz baths, hemorrhoid tucks (witch hazel pads)  Please contact office for follow-up if symptoms do not improve or worsen. Seek emergency care if symptoms become severe.   Relevant Medications   hydrocortisone-pramoxine (PROCTOFOAM HC) rectal foam       Meds ordered this encounter  Medications   hydrocortisone-pramoxine (PROCTOFOAM HC) rectal foam    Sig: Place 1 applicator rectally 2 (two) times daily.    Dispense:  10 g    Refill:  1    Order Specific Question:   Supervising Provider    Answer:   02/27/22 A [[id number]]    Return if symptoms worsen or fail to improve.  Danise Edge, NP

## 2021-12-29 ENCOUNTER — Ambulatory Visit: Payer: Medicaid Other | Admitting: Family Medicine

## 2022-01-22 DIAGNOSIS — Z419 Encounter for procedure for purposes other than remedying health state, unspecified: Secondary | ICD-10-CM | POA: Diagnosis not present

## 2022-02-21 DIAGNOSIS — Z419 Encounter for procedure for purposes other than remedying health state, unspecified: Secondary | ICD-10-CM | POA: Diagnosis not present

## 2022-03-24 DIAGNOSIS — Z419 Encounter for procedure for purposes other than remedying health state, unspecified: Secondary | ICD-10-CM | POA: Diagnosis not present

## 2022-04-23 DIAGNOSIS — Z419 Encounter for procedure for purposes other than remedying health state, unspecified: Secondary | ICD-10-CM | POA: Diagnosis not present

## 2022-05-24 DIAGNOSIS — Z419 Encounter for procedure for purposes other than remedying health state, unspecified: Secondary | ICD-10-CM | POA: Diagnosis not present

## 2022-06-24 DIAGNOSIS — Z419 Encounter for procedure for purposes other than remedying health state, unspecified: Secondary | ICD-10-CM | POA: Diagnosis not present

## 2022-07-23 DIAGNOSIS — Z419 Encounter for procedure for purposes other than remedying health state, unspecified: Secondary | ICD-10-CM | POA: Diagnosis not present

## 2022-08-23 DIAGNOSIS — Z419 Encounter for procedure for purposes other than remedying health state, unspecified: Secondary | ICD-10-CM | POA: Diagnosis not present

## 2022-09-22 DIAGNOSIS — Z419 Encounter for procedure for purposes other than remedying health state, unspecified: Secondary | ICD-10-CM | POA: Diagnosis not present

## 2022-10-23 DIAGNOSIS — Z419 Encounter for procedure for purposes other than remedying health state, unspecified: Secondary | ICD-10-CM | POA: Diagnosis not present

## 2022-11-22 DIAGNOSIS — Z419 Encounter for procedure for purposes other than remedying health state, unspecified: Secondary | ICD-10-CM | POA: Diagnosis not present

## 2022-12-23 DIAGNOSIS — Z419 Encounter for procedure for purposes other than remedying health state, unspecified: Secondary | ICD-10-CM | POA: Diagnosis not present

## 2023-01-23 DIAGNOSIS — Z419 Encounter for procedure for purposes other than remedying health state, unspecified: Secondary | ICD-10-CM | POA: Diagnosis not present

## 2023-02-22 DIAGNOSIS — Z419 Encounter for procedure for purposes other than remedying health state, unspecified: Secondary | ICD-10-CM | POA: Diagnosis not present

## 2023-03-25 DIAGNOSIS — Z419 Encounter for procedure for purposes other than remedying health state, unspecified: Secondary | ICD-10-CM | POA: Diagnosis not present

## 2023-04-24 DIAGNOSIS — Z419 Encounter for procedure for purposes other than remedying health state, unspecified: Secondary | ICD-10-CM | POA: Diagnosis not present

## 2023-05-25 DIAGNOSIS — Z419 Encounter for procedure for purposes other than remedying health state, unspecified: Secondary | ICD-10-CM | POA: Diagnosis not present

## 2023-06-25 DIAGNOSIS — Z419 Encounter for procedure for purposes other than remedying health state, unspecified: Secondary | ICD-10-CM | POA: Diagnosis not present

## 2023-06-26 IMAGING — US US TRANSVAGINAL NON-OB
1 series · 14 of 25 positions shown · non-contrast
Comparison: 04/24/2020

CLINICAL DATA: RIGHT lower quadrant pain, history of ovarian cysts,
LMP 01/12/2021

EXAM:
ULTRASOUND PELVIS TRANSVAGINAL
TECHNIQUE: Transvaginal ultrasound examination of the pelvis was performed
including evaluation of the uterus, ovaries, adnexal regions, and
pelvic cul-de-sac.

[Series 1: us transvaginal non-ob · 14 of 57 slices shown]
[im 1/57]
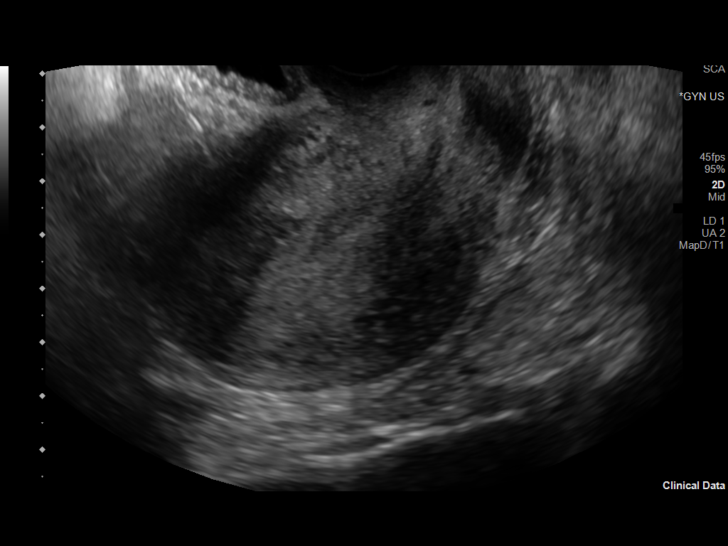
[im 5/57]
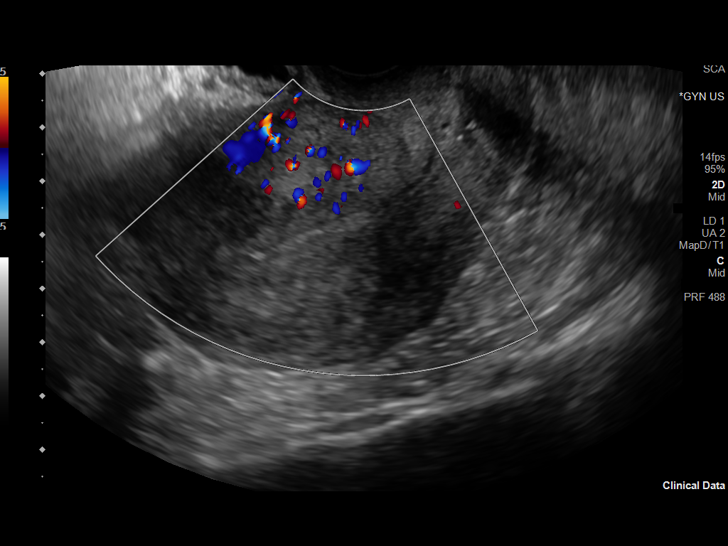
[im 10/57]
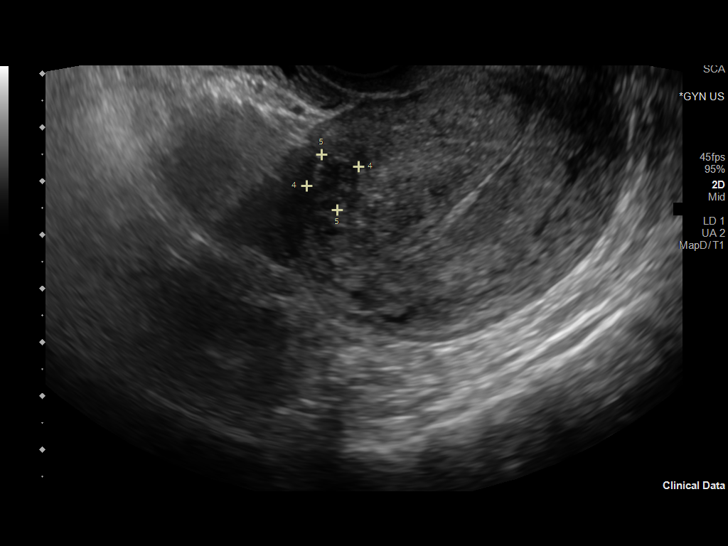
[im 15/57]
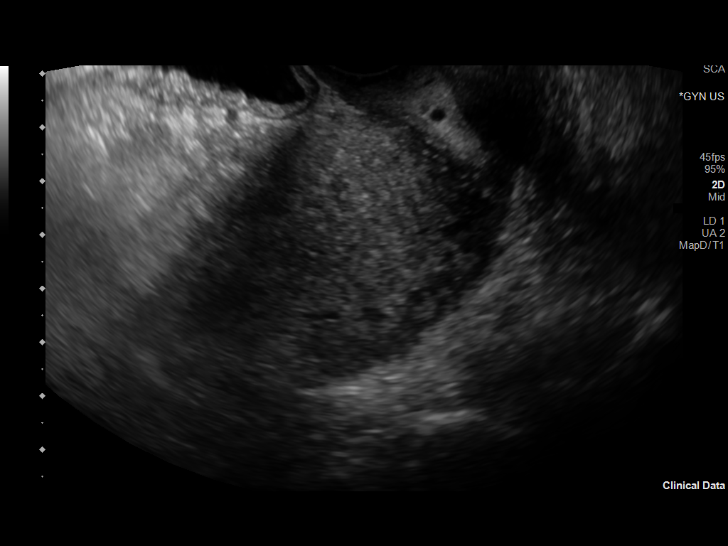
[im 19/57]
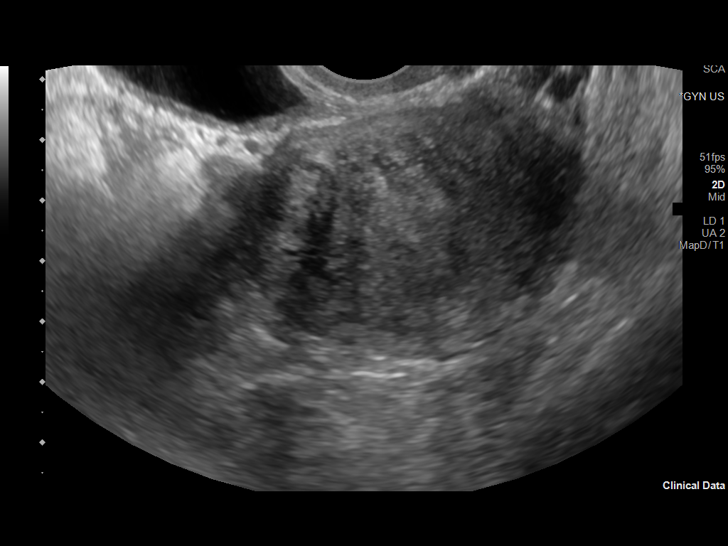
[im 22/57]
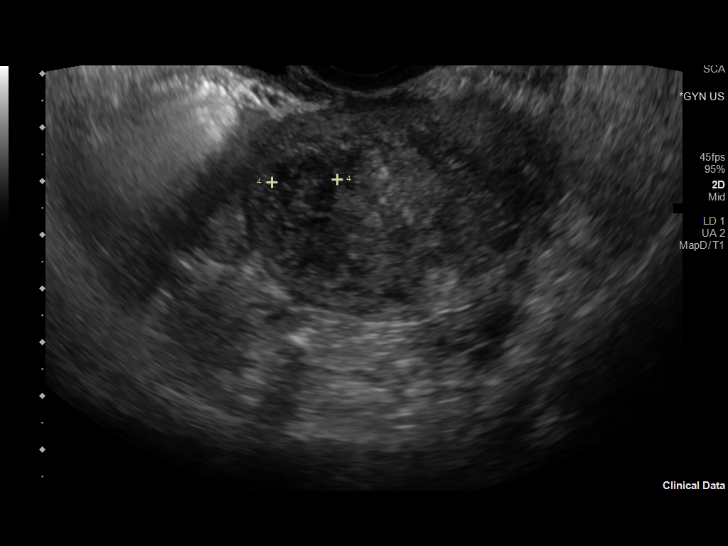
[im 26/57]
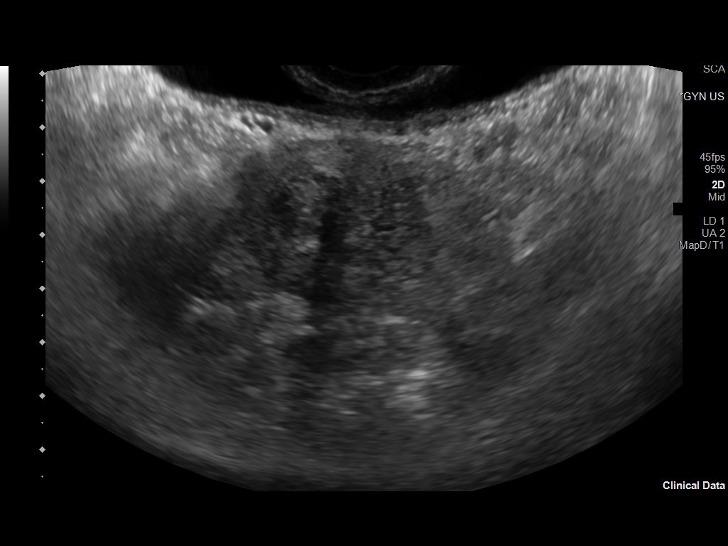
[im 31/57]
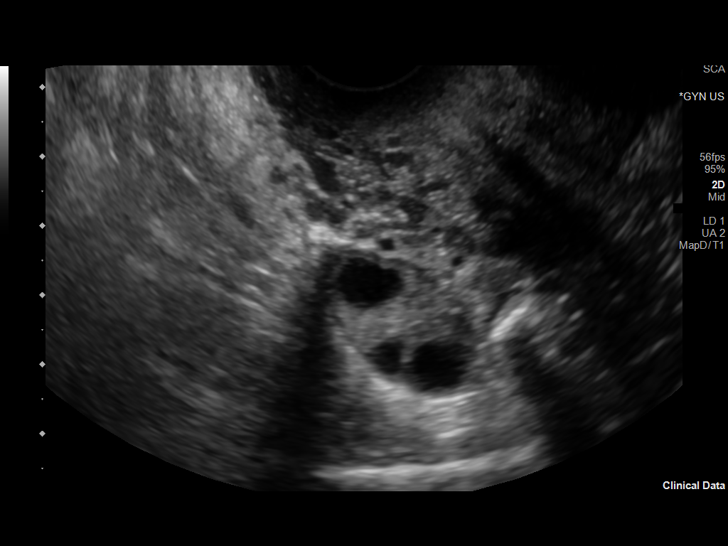
[im 36/57]
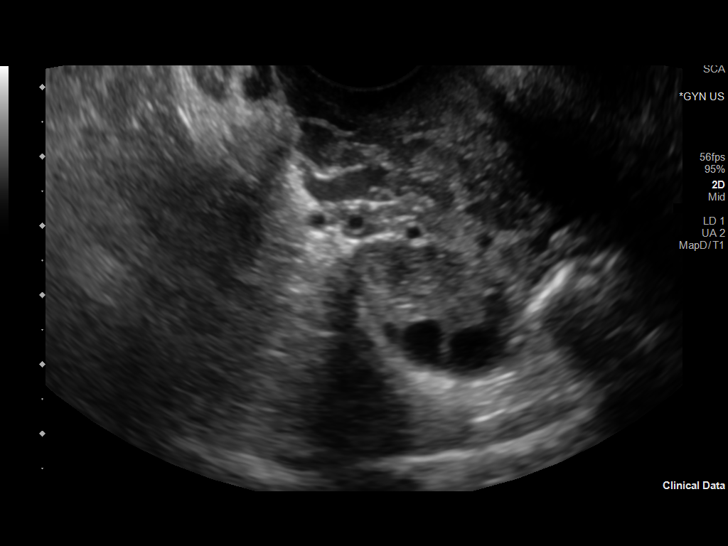
[im 38/57]
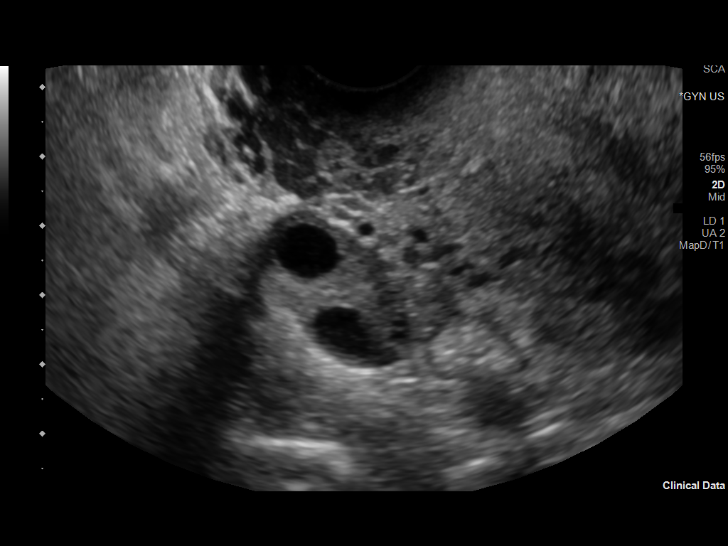
[im 43/57]
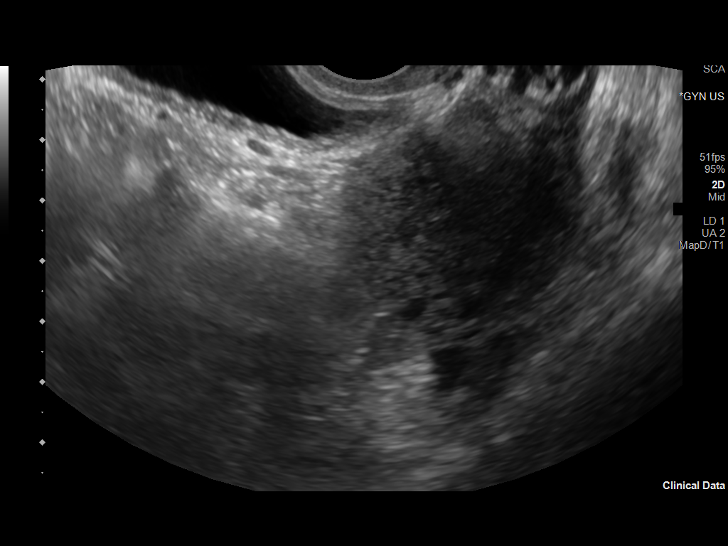
[im 47/57]
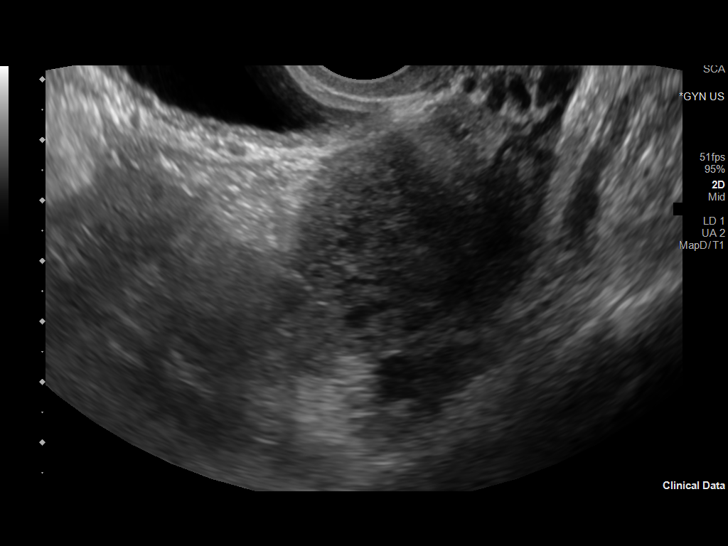
[im 52/57]
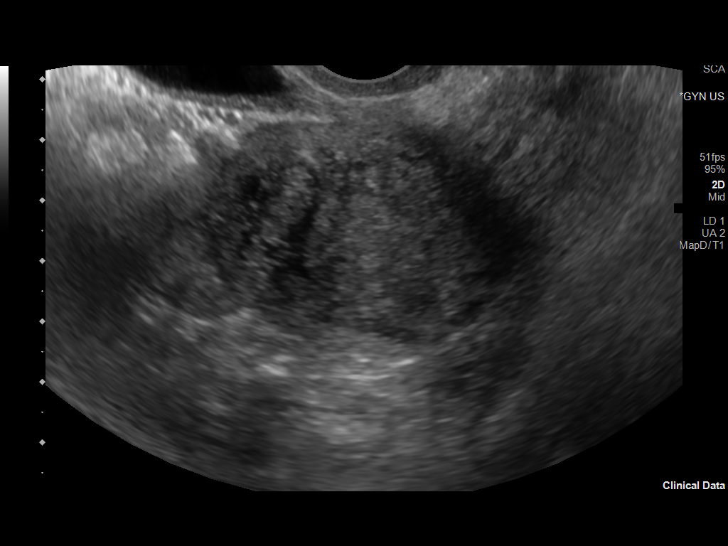
[im 57/57]
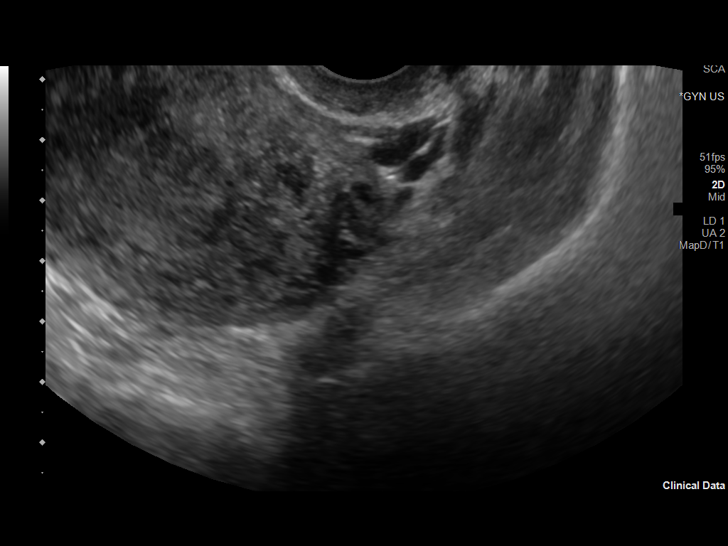

[14 of 25 positions shown; findings below may reference images not displayed]

FINDINGS: Uterus

Measurements: 8.0 x 5.2 x 6.5 cm = volume: 141 mL. Anteverted.
Heterogeneous myometrium. Intramural leiomyoma anterior upper uterus
16 x 11 x 12 mm. Additional intramural leiomyoma anteriorly at
fundus 10 x 11 x 11 mm.

Endometrium

Thickness: 6 mm.  No endometrial fluid or focal mass

Right ovary

Measurements: 2.9 x 1.7 x 1.8 cm = volume: 4.4 mL. Normal morphology
without mass

Left ovary

Measurements: 2.2 x 1.4 x 1.6 cm = volume: 2.6 mL. Less well
visualized due to position, posterior to fundus. No gross mass.

Other findings:  No free pelvic fluid or adnexal masses.
IMPRESSION: Two small intramural leiomyomata as above.

Remainder of exam unremarkable.

## 2023-07-23 DIAGNOSIS — Z419 Encounter for procedure for purposes other than remedying health state, unspecified: Secondary | ICD-10-CM | POA: Diagnosis not present

## 2023-08-12 ENCOUNTER — Ambulatory Visit (INDEPENDENT_AMBULATORY_CARE_PROVIDER_SITE_OTHER): Admitting: Medical

## 2023-08-12 VITALS — BP 110/64 | HR 73 | Temp 98.0°F | Resp 18 | Ht 62.0 in | Wt 172.0 lb

## 2023-08-12 DIAGNOSIS — Z1159 Encounter for screening for other viral diseases: Secondary | ICD-10-CM

## 2023-08-12 DIAGNOSIS — Z0000 Encounter for general adult medical examination without abnormal findings: Secondary | ICD-10-CM | POA: Diagnosis not present

## 2023-08-12 DIAGNOSIS — Z1231 Encounter for screening mammogram for malignant neoplasm of breast: Secondary | ICD-10-CM

## 2023-08-12 DIAGNOSIS — Z124 Encounter for screening for malignant neoplasm of cervix: Secondary | ICD-10-CM

## 2023-08-12 DIAGNOSIS — R739 Hyperglycemia, unspecified: Secondary | ICD-10-CM

## 2023-08-12 NOTE — Patient Instructions (Addendum)
 For you wellness exam today I have ordered cbc, cmp, hep c antibody, A1c and  lipid panel.  Vaccine up to date.   Recommend exercise and healthy diet.  We will let you know lab results as they come in.  Follow up date appointment will be determined after lab review.    Placed referral for screening pap and mammogram. Can talk to radiology today to get scheduled.  Preventive Care 23-43 Years Old, Female Preventive care refers to lifestyle choices and visits with your health care provider that can promote health and wellness. Preventive care visits are also called wellness exams. What can I expect for my preventive care visit? Counseling Your health care provider may ask you questions about your: Medical history, including: Past medical problems. Family medical history. Pregnancy history. Current health, including: Menstrual cycle. Method of birth control. Emotional well-being. Home life and relationship well-being. Sexual activity and sexual health. Lifestyle, including: Alcohol, nicotine or tobacco, and drug use. Access to firearms. Diet, exercise, and sleep habits. Work and work Astronomer. Sunscreen use. Safety issues such as seatbelt and bike helmet use. Physical exam Your health care provider will check your: Height and weight. These may be used to calculate your BMI (body mass index). BMI is a measurement that tells if you are at a healthy weight. Waist circumference. This measures the distance around your waistline. This measurement also tells if you are at a healthy weight and may help predict your risk of certain diseases, such as type 2 diabetes and high blood pressure. Heart rate and blood pressure. Body temperature. Skin for abnormal spots. What immunizations do I need?  Vaccines are usually given at various ages, according to a schedule. Your health care provider will recommend vaccines for you based on your age, medical history, and lifestyle or other factors,  such as travel or where you work. What tests do I need? Screening Your health care provider may recommend screening tests for certain conditions. This may include: Lipid and cholesterol levels. Diabetes screening. This is done by checking your blood sugar (glucose) after you have not eaten for a while (fasting). Pelvic exam and Pap test. Hepatitis B test. Hepatitis C test. HIV (human immunodeficiency virus) test. STI (sexually transmitted infection) testing, if you are at risk. Lung cancer screening. Colorectal cancer screening. Mammogram. Talk with your health care provider about when you should start having regular mammograms. This may depend on whether you have a family history of breast cancer. BRCA-related cancer screening. This may be done if you have a family history of breast, ovarian, tubal, or peritoneal cancers. Bone density scan. This is done to screen for osteoporosis. Talk with your health care provider about your test results, treatment options, and if necessary, the need for more tests. Follow these instructions at home: Eating and drinking  Eat a diet that includes fresh fruits and vegetables, whole grains, lean protein, and low-fat dairy products. Take vitamin and mineral supplements as recommended by your health care provider. Do not drink alcohol if: Your health care provider tells you not to drink. You are pregnant, may be pregnant, or are planning to become pregnant. If you drink alcohol: Limit how much you have to 0-1 drink a day. Know how much alcohol is in your drink. In the [place] one drink equals one 12 oz bottle of beer (355 mL), one 5 oz glass of wine (148 mL), or one 1 oz glass of hard liquor (44 mL). Lifestyle Brush your teeth every morning and night with  fluoride toothpaste. Floss one time each day. Exercise for at least 30 minutes 5 or more days each week. Do not use any products that contain nicotine or tobacco. These products include cigarettes,  chewing tobacco, and vaping devices, such as e-cigarettes. If you need help quitting, ask your health care provider. Do not use drugs. If you are sexually active, practice safe sex. Use a condom or other form of protection to prevent STIs. If you do not wish to become pregnant, use a form of birth control. If you plan to become pregnant, see your health care provider for a prepregnancy visit. Take aspirin only as told by your health care provider. Make sure that you understand how much to take and what form to take. Work with your health care provider to find out whether it is safe and beneficial for you to take aspirin daily. Find healthy ways to manage stress, such as: Meditation, yoga, or listening to music. Journaling. Talking to a trusted person. Spending time with friends and family. Minimize exposure to UV radiation to reduce your risk of skin cancer. Safety Always wear your seat belt while driving or riding in a vehicle. Do not drive: If you have been drinking alcohol. Do not ride with someone who has been drinking. When you are tired or distracted. While texting. If you have been using any mind-altering substances or drugs. Wear a helmet and other protective equipment during sports activities. If you have firearms in your house, make sure you follow all gun safety procedures. Seek help if you have been physically or sexually abused. What's next? Visit your health care provider once a year for an annual wellness visit. Ask your health care provider how often you should have your eyes and teeth checked. Stay up to date on all vaccines. This information is not intended to replace advice given to you by your health care provider. Make sure you discuss any questions you have with your health care provider. Document Revised: 11/05/2020 Document Reviewed: 11/05/2020 Elsevier Patient Education  2024 [employer].

## 2023-08-12 NOTE — Progress Notes (Signed)
 Subjective:    Patient ID: Tiffany Morse, female    DOB: Sep 19, 1980, 43 y.o.   MRN: [id number]  HPI  Pt in for wellness exam. She is not fasting.   Pt works for [employer]. Exerices at gym 2-3 times a week for about one hour. Pt states eat healthy just recently. Non smoker. No alcohol. 3 children.   Lmp- 3 weeks ago.  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 She thinks she got tetanus update.  Pt never had mammogram in past.   Pt last pap was 3 year. Will refer for pap.   Review of Systems  Constitutional:  Negative for chills, fatigue and unexpected weight change.  HENT:  Negative for congestion and drooling.   Respiratory:  Negative for cough, chest tightness and wheezing.   Cardiovascular:  Negative for chest pain and palpitations.  Gastrointestinal:  Negative for abdominal pain, blood in stool, constipation, diarrhea and nausea.  Genitourinary:  Negative for dysuria, pelvic pain and urgency.  Musculoskeletal:  Negative for back pain and myalgias.  Skin:  Negative for rash.  Neurological:  Negative for dizziness, syncope, weakness and headaches.  Hematological:  Negative for adenopathy. Does not bruise/bleed easily.  Psychiatric/Behavioral:  Negative for behavioral problems and confusion.     Past Medical History:  Diagnosis Date   Medical history non-contributory      Social History   Socioeconomic History   Marital status: Married    Spouse name: Not on file   Number of children: Not on file   Years of education: Not on file   Highest education level: Not on file  Occupational History   Not on file  Tobacco Use   Smoking status: Never   Smokeless tobacco: Never  Substance and Sexual Activity   Alcohol use: No   Drug use: No   Sexual activity: Not Currently    Birth control/protection: None  Other Topics Concern   Not on file  Social History Narrative   Not on file   Social Drivers of Health   Financial Resource Strain: Patient  Declined (08/11/2023)   Overall Financial Resource Strain (CARDIA)    Difficulty of Paying Living Expenses: Patient declined  Food Insecurity: Not on file  Transportation Needs: Patient Declined (08/11/2023)   PRAPARE - Transportation    Lack of Transportation (Medical): Patient declined    Lack of Transportation (Non-Medical): Patient declined  Physical Activity: Insufficiently Active (08/11/2023)   Exercise Vital Sign    Days of Exercise per Week: 2 days    Minutes of Exercise per Session: 40 min  Stress: Not on file  Social Connections: Moderately Integrated (08/11/2023)   Social Connection and Isolation Panel [NHANES]    Frequency of Communication with Friends and Family: Three times a week    Frequency of Social Gatherings with Friends and Family: Patient declined    Attends Religious Services: More than 4 times per year    Active Member of [employer] or Organizations: Yes    Attends Banker Meetings: Not on file    Marital Status: Divorced  Intimate Partner Violence: Not on file    Past Surgical History:  Procedure Laterality Date   NO PAST SURGERIES      No family history on file.  No Known Allergies  No current outpatient medications on file prior to visit.   No current facility-administered medications on file prior to visit.    BP 110/64   Pulse 73   Temp 98 F (  36.7 C)   Resp 18   Ht 5\' 2"  (1.575 m)   Wt 172 lb (78 kg)   SpO2 100%   BMI 31.46 kg/m        Objective:   Physical Exam  General Mental Status- Alert. General Appearance- Not in acute distress.   Skin General: Color- Normal Color. Moisture- Normal Moisture.  Neck Carotid Arteries- Normal color. Moisture- Normal Moisture. No carotid bruits. No JVD.  Chest and Lung Exam Auscultation: Breath Sounds: CTA  Cardiovascular Auscultation:Rythm- RRR Murmurs & Other Heart Sounds:Auscultation of the heart reveals- No Murmurs.  Abdomen Inspection:-Inspeection  Normal. Palpation/Percussion:Note:No mass. Palpation and Percussion of the abdomen reveal- Non Tender, Non Distended + BS, no rebound or guarding.   Neurologic Cranial Nerve exam:- CN III-XII intact(No nystagmus), symmetric smile. Strength:- 5/5 equal and symmetric strength both upper and lower extremities.       Assessment & Plan:   For you wellness exam today I have ordered cbc, cmp, hep c antibody, A1c and  lipid panel.  Vaccine up to date.   Recommend exercise and healthy diet.  We will let you know lab results as they come in.  Follow up date appointment will be determined after lab review.    Placed referral for screening pap and mammogram. Can talk to radiology today to get scheduled.  Esperanza Richters, PA-C

## 2023-08-15 ENCOUNTER — Encounter (HOSPITAL_BASED_OUTPATIENT_CLINIC_OR_DEPARTMENT_OTHER): Payer: Self-pay

## 2023-08-15 ENCOUNTER — Encounter: Payer: Self-pay | Admitting: Medical

## 2023-08-15 ENCOUNTER — Ambulatory Visit (HOSPITAL_BASED_OUTPATIENT_CLINIC_OR_DEPARTMENT_OTHER)
Admission: RE | Admit: 2023-08-15 | Discharge: 2023-08-15 | Disposition: A | Discharge status: Home / Self Care | Source: Ambulatory Visit | Attending: Medical | Admitting: Medical

## 2023-08-15 DIAGNOSIS — Z1231 Encounter for screening mammogram for malignant neoplasm of breast: Secondary | ICD-10-CM | POA: Diagnosis not present

## 2023-08-17 ENCOUNTER — Encounter: Payer: Self-pay | Admitting: Medical

## 2023-08-17 NOTE — Telephone Encounter (Signed)
 Copied from CRM [id number]. Topic: General - Other >> Aug 17, 2023  2:12 PM Abigail D wrote: Reason for CRM: PT received a phone call and would like a call back.

## 2023-08-18 ENCOUNTER — Other Ambulatory Visit (INDEPENDENT_AMBULATORY_CARE_PROVIDER_SITE_OTHER)

## 2023-08-18 ENCOUNTER — Other Ambulatory Visit: Payer: Self-pay | Admitting: Medical

## 2023-08-18 DIAGNOSIS — Z13220 Encounter for screening for lipoid disorders: Secondary | ICD-10-CM | POA: Diagnosis not present

## 2023-08-18 DIAGNOSIS — R928 Other abnormal and inconclusive findings on diagnostic imaging of breast: Secondary | ICD-10-CM

## 2023-08-18 DIAGNOSIS — Z0000 Encounter for general adult medical examination without abnormal findings: Secondary | ICD-10-CM

## 2023-08-18 DIAGNOSIS — Z1159 Encounter for screening for other viral diseases: Secondary | ICD-10-CM | POA: Diagnosis not present

## 2023-08-18 DIAGNOSIS — R739 Hyperglycemia, unspecified: Secondary | ICD-10-CM | POA: Diagnosis not present

## 2023-08-18 LAB — CBC WITH DIFFERENTIAL/PLATELET
Basophils Absolute: 0 10*3/uL (ref 0.0–0.1)
Basophils Relative: 0.7 % (ref 0.0–3.0)
Eosinophils Absolute: 0.1 10*3/uL (ref 0.0–0.7)
Eosinophils Relative: 2.2 % (ref 0.0–5.0)
HCT: 31.6 % — ABNORMAL LOW (ref 36.0–46.0)
Hemoglobin: 10.4 g/dL — ABNORMAL LOW (ref 12.0–15.0)
Lymphocytes Relative: 44.4 % (ref 12.0–46.0)
Lymphs Abs: 2 10*3/uL (ref 0.7–4.0)
MCHC: 32.8 g/dL (ref 30.0–36.0)
MCV: 82.7 fl (ref 78.0–100.0)
Monocytes Absolute: 0.3 10*3/uL (ref 0.1–1.0)
Monocytes Relative: 6.3 % (ref 3.0–12.0)
Neutro Abs: 2.1 10*3/uL (ref 1.4–7.7)
Neutrophils Relative %: 46.4 % (ref 43.0–77.0)
Platelets: 261 10*3/uL (ref 150.0–400.0)
RBC: 3.83 Mil/uL — ABNORMAL LOW (ref 3.87–5.11)
RDW: 16.8 % — ABNORMAL HIGH (ref 11.5–15.5)
WBC: 4.6 10*3/uL (ref 4.0–10.5)

## 2023-08-18 LAB — COMPREHENSIVE METABOLIC PANEL WITH GFR
ALT: 9 U/L (ref 0–35)
AST: 11 U/L (ref 0–37)
Albumin: 4.2 g/dL (ref 3.5–5.2)
Alkaline Phosphatase: 68 U/L (ref 39–117)
BUN: 13 mg/dL (ref 6–23)
CO2: 27 meq/L (ref 19–32)
Calcium: 8.8 mg/dL (ref 8.4–10.5)
Chloride: 105 meq/L (ref 96–112)
Creatinine, Ser: 0.61 mg/dL (ref 0.40–1.20)
GFR: 109.77 mL/min (ref >60.00)
Glucose, Bld: 88 mg/dL (ref 70–99)
Potassium: 4.1 meq/L (ref 3.5–5.1)
Sodium: 138 meq/L (ref 135–145)
Total Bilirubin: 0.7 mg/dL (ref 0.2–1.2)
Total Protein: 6.7 g/dL (ref 6.0–8.3)

## 2023-08-18 LAB — LIPID PANEL
Cholesterol: 217 mg/dL — ABNORMAL HIGH (ref 0–200)
HDL: 49.5 mg/dL (ref >39.00)
LDL Cholesterol: 139 mg/dL — ABNORMAL HIGH (ref 0–99)
NonHDL: 167.81
Total CHOL/HDL Ratio: 4
Triglycerides: 142 mg/dL (ref 0.0–149.0)
VLDL: 28.4 mg/dL (ref 0.0–40.0)

## 2023-08-19 ENCOUNTER — Encounter: Payer: Self-pay | Admitting: Medical

## 2023-08-19 ENCOUNTER — Other Ambulatory Visit

## 2023-08-19 LAB — HEPATITIS C ANTIBODY: Hepatitis C Ab: NONREACTIVE

## 2023-08-20 LAB — HEMOGLOBIN A1C: Hgb A1c MFr Bld: 5.8 % (ref 4.6–6.5)

## 2023-08-29 ENCOUNTER — Ambulatory Visit
Admission: RE | Admit: 2023-08-29 | Discharge: 2023-08-29 | Disposition: A | Discharge status: Home / Self Care | Source: Ambulatory Visit | Attending: Medical | Admitting: Medical

## 2023-08-29 DIAGNOSIS — R928 Other abnormal and inconclusive findings on diagnostic imaging of breast: Secondary | ICD-10-CM

## 2023-08-29 DIAGNOSIS — N6002 Solitary cyst of left breast: Secondary | ICD-10-CM | POA: Diagnosis not present

## 2023-08-29 DIAGNOSIS — N6321 Unspecified lump in the left breast, upper outer quadrant: Secondary | ICD-10-CM | POA: Diagnosis not present

## 2023-08-29 DIAGNOSIS — N6001 Solitary cyst of right breast: Secondary | ICD-10-CM | POA: Diagnosis not present

## 2023-08-29 DIAGNOSIS — N6315 Unspecified lump in the right breast, overlapping quadrants: Secondary | ICD-10-CM | POA: Diagnosis not present

## 2023-09-03 DIAGNOSIS — Z419 Encounter for procedure for purposes other than remedying health state, unspecified: Secondary | ICD-10-CM | POA: Diagnosis not present

## 2023-09-05 ENCOUNTER — Encounter: Payer: Self-pay | Admitting: Medical

## 2023-09-05 ENCOUNTER — Ambulatory Visit (INDEPENDENT_AMBULATORY_CARE_PROVIDER_SITE_OTHER): Admitting: Medical

## 2023-09-05 VITALS — BP 130/70 | HR 76 | Wt 174.0 lb

## 2023-09-05 DIAGNOSIS — D649 Anemia, unspecified: Secondary | ICD-10-CM | POA: Diagnosis not present

## 2023-09-05 DIAGNOSIS — R5383 Other fatigue: Secondary | ICD-10-CM | POA: Diagnosis not present

## 2023-09-05 NOTE — Patient Instructions (Signed)
 Anemia Chronic anemia with fluctuating hemoglobin levels, likely due to iron deficiency. Light regular menstrual bleeding. No gastrointestinal bleeding signs. Fatigue may also possibly related to anemia, thyroid dysfunction, or vitamin deficiencies. - Order CBC and iron panel. - Order stool test for occult blood. - Order thyroid function tests. - Order vitamin B12 and B1 levels. - Prescribe iron supplements if iron deficiency confirmed. - Refer to gastroenterologist if stool test positive for blood.  Follow up date to determined after lab review.

## 2023-09-05 NOTE — Progress Notes (Signed)
 Subjective:    Patient ID: Tiffany Morse, female    DOB: 1980-10-10, 43 y.o.   MRN: [id number]  HPI Discussed the use of AI scribe software for clinical note transcription with the patient, who gave verbal consent to proceed.  History of Present Illness   Tiffany Morse is a 43 year old female who presents with recurrent anemia.  She has a history of anemia over the past several years, with hemoglobin levels previously recorded  mild anemia intermittently over past 10 years. Last level hb/hct was normal. Prior anemia found before patient established with myself. Recent wellness exam revealed recurrent anemia..   She experiences fatigue and tiredness, which she attributes to her anemia. No heavy menstrual bleeding, as her periods last for two days with minimal bleeding. No black or tarry stools, indicating no apparent gastrointestinal bleeding.  Her diet includes red meat, though not in large quantities, and she reports eating well overall.        Review of Systems  Constitutional:  Positive for fatigue. Negative for chills and fever.  Respiratory:  Negative for cough, choking, shortness of breath and wheezing.   Cardiovascular:  Negative for chest pain and palpitations.  Gastrointestinal:  Negative for abdominal pain.  Musculoskeletal:  Negative for back pain, myalgias and neck stiffness.  Skin:  Negative for rash.  Neurological:  Negative for dizziness, syncope, weakness and headaches.  Hematological:  Negative for adenopathy. Does not bruise/bleed easily.  Psychiatric/Behavioral:  Negative for behavioral problems and confusion.     Past Medical History:  Diagnosis Date   Medical history non-contributory      Social History   Socioeconomic History   Marital status: Married    Spouse name: Not on file   Number of children: Not on file   Years of education: Not on file   Highest education level: Not on file  Occupational History   Not on file  Tobacco Use    Smoking status: Never   Smokeless tobacco: Never  Substance and Sexual Activity   Alcohol use: No   Drug use: No   Sexual activity: Not Currently    Birth control/protection: None  Other Topics Concern   Not on file  Social History Narrative   Not on file   Social Drivers of Health   Financial Resource Strain: Patient Declined (08/11/2023)   Overall Financial Resource Strain (CARDIA)    Difficulty of Paying Living Expenses: Patient declined  Food Insecurity: Not on file  Transportation Needs: Patient Declined (08/11/2023)   PRAPARE - Transportation    Lack of Transportation (Medical): Patient declined    Lack of Transportation (Non-Medical): Patient declined  Physical Activity: Insufficiently Active (08/11/2023)   Exercise Vital Sign    Days of Exercise per Week: 2 days    Minutes of Exercise per Session: 40 min  Stress: Not on file  Social Connections: Moderately Integrated (08/11/2023)   Social Connection and Isolation Panel [NHANES]    Frequency of Communication with Friends and Family: Three times a week    Frequency of Social Gatherings with Friends and Family: Patient declined    Attends Religious Services: More than 4 times per year    Active Member of [employer] or Organizations: Yes    Attends Banker Meetings: Not on file    Marital Status: Divorced  Intimate Partner Violence: Not on file    Past Surgical History:  Procedure Laterality Date   NO PAST SURGERIES      No family  history on file.  No Known Allergies  No current outpatient medications on file prior to visit.   No current facility-administered medications on file prior to visit.    BP 130/70   Pulse 76   Wt 174 lb (78.9 kg)   LMP 07/18/2023 (Approximate)   SpO2 98%   BMI 31.83 kg/m        Objective:   Physical Exam   General Mental Status- Alert. General Appearance- Not in acute distress.     Neck No JVD.  Chest and Lung Exam Auscultation: Breath  Sounds:-Normal.  Cardiovascular Auscultation:Rythm- Regular. Murmurs & Other Heart Sounds:Auscultation of the heart reveals- No Murmurs.  Abdomen Inspection:-Inspeection Normal. Palpation/Percussion:Note:No mass. Palpation and Percussion of the abdomen reveal- Non Tender, Non Distended + BS, no rebound or guarding.  Neurologic Cranial Nerve exam:- CN III-XII intact(No nystagmus), symmetric smile. Strength:- 5/5 equal and symmetric strength both upper and lower extremities.      Assessment & Plan:   Assessment and Plan    Anemia Chronic anemia with fluctuating hemoglobin levels, likely due to iron deficiency. Light regular menstrual bleeding. No gastrointestinal bleeding signs. Fatigue may also possibly related to anemia, thyroid dysfunction, or vitamin deficiencies. - Order CBC and iron panel. - Order stool test for occult blood. - Order thyroid function tests. - Order vitamin B12 and B1 levels. - Prescribe iron supplements if iron deficiency confirmed. - Refer to gastroenterologist if stool test positive for blood.   Follow up date to determined after lab review.        Lynnae Ludemann, PA-C

## 2023-09-06 ENCOUNTER — Encounter: Payer: Self-pay | Admitting: Medical

## 2023-09-06 LAB — CBC WITH DIFFERENTIAL/PLATELET
Basophils Absolute: 0.1 10*3/uL (ref 0.0–0.1)
Basophils Relative: 1.3 % (ref 0.0–3.0)
Eosinophils Absolute: 0.1 10*3/uL (ref 0.0–0.7)
Eosinophils Relative: 1.8 % (ref 0.0–5.0)
HCT: 34.3 % — ABNORMAL LOW (ref 36.0–46.0)
Hemoglobin: 11.3 g/dL — ABNORMAL LOW (ref 12.0–15.0)
Lymphocytes Relative: 43.5 % (ref 12.0–46.0)
Lymphs Abs: 2.7 10*3/uL (ref 0.7–4.0)
MCHC: 32.8 g/dL (ref 30.0–36.0)
MCV: 81.5 fl (ref 78.0–100.0)
Monocytes Absolute: 0.4 10*3/uL (ref 0.1–1.0)
Monocytes Relative: 6.3 % (ref 3.0–12.0)
Neutro Abs: 2.9 10*3/uL (ref 1.4–7.7)
Neutrophils Relative %: 47.1 % (ref 43.0–77.0)
Platelets: 261 10*3/uL (ref 150.0–400.0)
RBC: 4.21 Mil/uL (ref 3.87–5.11)
RDW: 16.4 % — ABNORMAL HIGH (ref 11.5–15.5)
WBC: 6.2 10*3/uL (ref 4.0–10.5)

## 2023-09-06 LAB — TSH: TSH: 1.14 u[IU]/mL (ref 0.35–5.50)

## 2023-09-06 LAB — VITAMIN B12: Vitamin B-12: 187 pg/mL — ABNORMAL LOW (ref 211–911)

## 2023-09-06 LAB — T4, FREE: Free T4: 0.75 ng/dL (ref 0.60–1.60)

## 2023-09-06 MED ORDER — IRON (FERROUS SULFATE) 325 (65 FE) MG PO TABS: ORAL_TABLET | ORAL | 3 refills | Status: DC

## 2023-09-06 NOTE — Addendum Note (Signed)
 Addended by: Serafina Damme on: 09/06/2023 06:18 AM   Modules accepted: Orders

## 2023-09-09 LAB — IRON,TIBC AND FERRITIN PANEL
%SAT: 4 % — ABNORMAL LOW (ref 16–45)
Ferritin: 4 ng/mL — ABNORMAL LOW (ref 16–232)
Iron: 19 ug/dL — ABNORMAL LOW (ref 40–190)
TIBC: 447 ug/dL (ref 250–450)

## 2023-09-09 LAB — VITAMIN B1: Vitamin B1 (Thiamine): 15 nmol/L (ref 8–30)

## 2023-09-09 NOTE — Addendum Note (Signed)
 Addended by: Serafina Damme on: 09/09/2023 08:18 AM   Modules accepted: Orders

## 2023-09-13 ENCOUNTER — Ambulatory Visit (INDEPENDENT_AMBULATORY_CARE_PROVIDER_SITE_OTHER)

## 2023-09-13 DIAGNOSIS — D649 Anemia, unspecified: Secondary | ICD-10-CM | POA: Diagnosis not present

## 2023-09-13 MED ORDER — CYANOCOBALAMIN 1000 MCG/ML IJ SOLN
1000.0000 ug | Freq: Once | INTRAMUSCULAR | Status: AC
  Administered 2023-09-13: 1000 ug via INTRAMUSCULAR

## 2023-09-13 NOTE — Progress Notes (Signed)
 Patient here for monthly b12 injection per physicians order.  Injection given in left deltoid and patient tolerated well.

## 2023-09-20 ENCOUNTER — Ambulatory Visit (INDEPENDENT_AMBULATORY_CARE_PROVIDER_SITE_OTHER)

## 2023-09-20 DIAGNOSIS — D649 Anemia, unspecified: Secondary | ICD-10-CM

## 2023-09-20 MED ORDER — CYANOCOBALAMIN 1000 MCG/ML IJ SOLN
1000.0000 ug | Freq: Once | INTRAMUSCULAR | Status: AC
Start: 2023-09-20 — End: 2023-09-20
  Administered 2023-09-20: 1000 ug via INTRAMUSCULAR

## 2023-09-20 NOTE — Progress Notes (Signed)
 Tiffany Morse is a 43 y.o. female presents to the office today for 2/4 weekly B12 injection, per physician's orders. Original order: 09/06/23: "Your b12 level is low. This might  be a  contributing reason for your fatigue on top of anemia. Recommend getting set up for weekly b12 vitamin injection over next 4 weeks followed by monthly injection for 5 months. Then repeat b12 level in 6 months.  Cyanocobalamin  1000 mcg/ml IM was administered L Deltoid today. Patient tolerated injection. Patient due for follow up labs/provider appt: No. Date due: 02/06/24, appt made No Patient next injection due: 1 week for 3/4 weekly B12, appt made Yes  [name], [name]

## 2023-09-27 ENCOUNTER — Ambulatory Visit (INDEPENDENT_AMBULATORY_CARE_PROVIDER_SITE_OTHER): Admitting: Emergency Medicine

## 2023-09-27 DIAGNOSIS — D649 Anemia, unspecified: Secondary | ICD-10-CM

## 2023-09-27 MED ORDER — CYANOCOBALAMIN 1000 MCG/ML IJ SOLN
1000.0000 ug | Freq: Once | INTRAMUSCULAR | Status: AC
  Administered 2023-09-27: 1000 ug via INTRAMUSCULAR

## 2023-09-27 NOTE — Progress Notes (Signed)
 Patient here for monthly b12 injection per physicians order.  Injection given in left deltoid and patient tolerated well.

## 2023-10-03 DIAGNOSIS — Z419 Encounter for procedure for purposes other than remedying health state, unspecified: Secondary | ICD-10-CM | POA: Diagnosis not present

## 2023-10-04 ENCOUNTER — Ambulatory Visit

## 2023-10-04 DIAGNOSIS — E538 Deficiency of other specified B group vitamins: Secondary | ICD-10-CM

## 2023-10-04 MED ORDER — CYANOCOBALAMIN 1000 MCG/ML IJ SOLN
1000.0000 ug | Freq: Once | INTRAMUSCULAR | Status: AC
  Administered 2023-10-04: 1000 ug via INTRAMUSCULAR

## 2023-10-04 NOTE — Progress Notes (Signed)
 Pt here for 4/4 wekely B12 injection per original order dated: Gaylin Ke Saguier,PA-C   Last B12 injection:09/20/23  Last B12 level: 09/05/2023   B12 1000mcg given IM, and pt tolerated injection well.  Next B12 injection scheduled for:  11/07/2023 at her follow up with PCP

## 2023-10-27 ENCOUNTER — Other Ambulatory Visit (HOSPITAL_COMMUNITY)
Admission: RE | Admit: 2023-10-27 | Discharge: 2023-10-27 | Disposition: A | Discharge status: Home / Self Care | Source: Ambulatory Visit | Attending: Family Medicine | Admitting: Family Medicine

## 2023-10-27 ENCOUNTER — Encounter: Payer: Self-pay | Admitting: Family Medicine

## 2023-10-27 ENCOUNTER — Ambulatory Visit: Admitting: Family Medicine

## 2023-10-27 VITALS — BP 105/66 | HR 68 | Ht 62.0 in | Wt 173.0 lb

## 2023-10-27 DIAGNOSIS — Z01419 Encounter for gynecological examination (general) (routine) without abnormal findings: Secondary | ICD-10-CM

## 2023-10-27 DIAGNOSIS — R1031 Right lower quadrant pain: Secondary | ICD-10-CM

## 2023-10-27 DIAGNOSIS — G8929 Other chronic pain: Secondary | ICD-10-CM

## 2023-10-27 NOTE — Progress Notes (Signed)
 ANNUAL EXAM Patient name: Tiffany Morse MRN [id number]  Date of birth: 1980-07-26 Chief Complaint:   Annual Exam  History of Present Illness:   Tiffany Morse is a 43 y.o.  [id number]  female  being seen today for a routine annual exam.  Current complaints: Chronic RLQ pain. Comes and goes, sometimes worse during menses. Not associated with movement, food, stooling, etc. Pain mild to moderate - more like an ache.   Patient's last menstrual period was 10/13/2023 (exact date).    Last pap 2022. Results were: NILM w/ HRHPV negative. H/O abnormal pap: no Last mammogram: 08/2023. Results were: normal.  Last colonoscopy: n/a     10/27/2023    8:54 AM 08/12/2023    2:43 PM 04/23/2020    3:52 PM 05/10/2016    2:05 PM 04/01/2016    3:14 PM  Depression screen PHQ 2/9  Decreased Interest 0 0 0 0 0  Down, Depressed, Hopeless 0 0 0 0 0  PHQ - 2 Score 0 0 0 0 0  Altered sleeping    0 0  Tired, decreased energy 0   0 1  Change in appetite 0   1 1  Feeling bad or failure about yourself  1   0 0  Trouble concentrating 0   0 0  Moving slowly or fidgety/restless 0   0 0  Suicidal thoughts 0   0 0  PHQ-9 Score    1 2        10/27/2023    8:54 AM 05/10/2016    2:05 PM 04/01/2016    3:14 PM  GAD 7 : Generalized Anxiety Score  Nervous, Anxious, on Edge 0 0 0  Control/stop worrying 0 0 0  Worry too much - different things 0  0  Trouble relaxing 0 0 0  Restless 0 0 0  Easily annoyed or irritable 1 0 0  Afraid - awful might happen 0 0 0  Total GAD 7 Score 1  0     Review of Systems:   Pertinent items are noted in HPI Denies any headaches, blurred vision, fatigue, shortness of breath, chest pain, abdominal pain, abnormal vaginal discharge/itching/odor/irritation, problems with periods, bowel movements, urination, or intercourse unless otherwise stated above. Pertinent History Reviewed:  Reviewed past medical,surgical, social and family history.  Reviewed problem list, medications and  allergies. Physical Assessment:   Vitals:   10/27/23 0851  BP: 105/66  Pulse: 68  Weight: 173 lb (78.5 kg)  Height: 5\' 2"  (1.575 m)  Body mass index is 31.64 kg/m.        Physical Examination:   General appearance - well appearing, and in no distress  Mental status - alert, oriented to person, place, and time  Psych:  She has a normal mood and affect  Skin - warm and dry, normal color, no suspicious lesions noted  Chest - effort normal, all lung fields clear to auscultation bilaterally  Heart - normal rate and regular rhythm  Neck:  midline trachea, no thyromegaly or nodules  Breasts - breasts appear normal, no suspicious masses, no skin or nipple changes or axillary nodes  Abdomen - soft, mild RLQ tenderness. No tenderness in the adnexa, nondistended, no masses or organomegaly  Pelvic - VULVA: normal appearing vulva with no masses, tenderness or lesions  VAGINA: normal appearing vagina with normal color and discharge, no lesions  CERVIX: normal appearing cervix without discharge or lesions, no CMT  Thin prep pap is done with HR  HPV cotesting  UTERUS: uterus is felt to be normal size, shape, consistency and nontender   ADNEXA: No adnexal masses or tenderness noted.  Extremities:  No swelling or varicosities noted  Chaperone present for exam  Assessment & Plan:  1. Well woman exam with routine gynecological exam (Primary) - Cytology - PAP  2. Chronic RLQ pain Doubtful that this pain represents ovarian or uterine pathology. Will check US  to ensure. May need to see primary care provider - may need CT scan or other imaging. - US  PELVIC COMPLETE WITH TRANSVAGINAL; Future   Labs/procedures today: n/a  Orders Placed This Encounter  Procedures   US  PELVIC COMPLETE WITH TRANSVAGINAL    Meds: No orders of the defined types were placed in this encounter.   Follow-up: No follow-ups on file.  Dymond Gutt J Adeyemi Hamad, DO 10/27/2023 9:36 AM

## 2023-10-29 ENCOUNTER — Ambulatory Visit (HOSPITAL_BASED_OUTPATIENT_CLINIC_OR_DEPARTMENT_OTHER)
Admission: RE | Admit: 2023-10-29 | Discharge: 2023-10-29 | Disposition: A | Discharge status: Home / Self Care | Source: Ambulatory Visit | Attending: Family Medicine | Admitting: Family Medicine

## 2023-10-29 DIAGNOSIS — R1031 Right lower quadrant pain: Secondary | ICD-10-CM | POA: Diagnosis not present

## 2023-10-29 DIAGNOSIS — G8929 Other chronic pain: Secondary | ICD-10-CM | POA: Insufficient documentation

## 2023-10-29 DIAGNOSIS — D259 Leiomyoma of uterus, unspecified: Secondary | ICD-10-CM | POA: Diagnosis not present

## 2023-11-01 ENCOUNTER — Ambulatory Visit: Payer: Self-pay | Admitting: Family Medicine

## 2023-11-01 LAB — CYTOLOGY - PAP
Comment: NEGATIVE
Diagnosis: NEGATIVE
High risk HPV: NEGATIVE

## 2023-11-03 DIAGNOSIS — Z419 Encounter for procedure for purposes other than remedying health state, unspecified: Secondary | ICD-10-CM | POA: Diagnosis not present

## 2023-11-07 ENCOUNTER — Ambulatory Visit (INDEPENDENT_AMBULATORY_CARE_PROVIDER_SITE_OTHER): Admitting: Medical

## 2023-11-07 ENCOUNTER — Encounter: Payer: Self-pay | Admitting: Medical

## 2023-11-07 VITALS — BP 128/88 | HR 79 | Ht 62.0 in | Wt 178.0 lb

## 2023-11-07 DIAGNOSIS — E538 Deficiency of other specified B group vitamins: Secondary | ICD-10-CM | POA: Diagnosis not present

## 2023-11-07 DIAGNOSIS — D649 Anemia, unspecified: Secondary | ICD-10-CM

## 2023-11-07 LAB — VITAMIN B12: Vitamin B-12: >2000 pg/mL — ABNORMAL HIGH (ref 200–1100)

## 2023-11-07 MED ORDER — CYANOCOBALAMIN 1000 MCG/ML IJ SOLN
1000.0000 ug | Freq: Once | INTRAMUSCULAR | Status: AC
  Administered 2023-11-07: 1000 ug via INTRAMUSCULAR

## 2023-11-07 NOTE — Patient Instructions (Signed)
 Iron  Deficiency Anemia Iron  deficiency anemia with low iron  and ferritin levels. Currently on iron  supplements. Stool test for fecal occult blood pending. - Order iron  panel and CBC. - Provide stool test kit for fecal occult blood. - Advise completion and return of stool test. - Communicate results of CBC, iron  panel, and stool test.  Vitamin B12 Deficiency Receiving B12 injections. Fifth injection administered today. Plan to monitor B12 levels in three weeks to determine further treatment. - Order B12 level in three weeks. - Advise scheduling lab appointment for B12 level check.  Follow up date to be determined after lab review

## 2023-11-07 NOTE — Progress Notes (Signed)
   Subjective:    Patient ID: Tiffany Morse, female    DOB: Mar 05, 1981, 43 y.o.   MRN: [id number]  HPI  Discussed the use of AI scribe software for clinical note transcription with the patient, who gave verbal consent to proceed.  History of Present Illness   Tiffany Morse is a 43 year old female with anemia and vitamin B12 deficiency who presents for follow-up on her blood work and vitamin B12 injections.  She has been receiving vitamin B12 injections, with the most recent one administered today, totaling five injections. Previous blood work indicated low vitamin B12 levels.   Pt fatigue much less/improved since using b12.   She has a history of anemia, with recent blood work showing low iron  and ferritin levels. She is currently taking iron  supplements, two tablets daily. A previous attempt to provide a stool sample for fecal occult blood testing was unsuccessful due to the facility being closed. She plans to repeat the stool test to check for blood in her stool.  A recent ultrasound report noted a small fibroid in her ovaries, although the ovaries themselves appeared normal. Her Pap smear and cytology results were normal. Pt has been seen by gynecologist.  Her thyroid  function tests were normal, with no abnormalities reported.       Review of Systems See hpi    Objective:   Physical Exam  General Mental Status- Alert. General Appearance- Not in acute distress.   Skin General: Color- Normal Color. Moisture- Normal Moisture.  Neck  No JVD.  Chest and Lung Exam Auscultation: Breath Sounds:-CTA  Cardiovascular Auscultation:Rythm- RRR Murmurs & Other Heart Sounds:Auscultation of the heart reveals- No Murmurs.  Abdomen Inspection:-Inspeection Normal. Palpation/Percussion:Note:No mass. Palpation and Percussion of the abdomen reveal- Non Tender, Non Distended + BS, no rebound or guarding.   Neurologic Cranial Nerve exam:- CN III-XII intact(No nystagmus),  symmetric smile. Strength:- 5/5 equal and symmetric strength both upper and lower extremities.       Assessment & Plan:   Patient Instructions  Iron  Deficiency Anemia Iron  deficiency anemia with low iron  and ferritin levels. Currently on iron  supplements. Stool test for fecal occult blood pending. - Order iron  panel and CBC. - Provide stool test kit for fecal occult blood. - Advise completion and return of stool test. - Communicate results of CBC, iron  panel, and stool test.  Vitamin B12 Deficiency Receiving B12 injections. Fifth injection administered today. Plan to monitor B12 levels in three weeks to determine further treatment. - Order B12 level in three weeks. - Advise scheduling lab appointment for B12 level check.  Follow up date to be determined after lab review   Sylvia Everts, PA-C

## 2023-11-08 ENCOUNTER — Ambulatory Visit: Payer: Self-pay | Admitting: Medical

## 2023-11-08 LAB — CBC WITH DIFFERENTIAL/PLATELET
Absolute Lymphocytes: 2536 {cells}/uL (ref 850–3900)
Absolute Monocytes: 462 {cells}/uL (ref 200–950)
Basophils Absolute: 48 {cells}/uL (ref 0–200)
Basophils Relative: 0.7 %
Eosinophils Absolute: 156 {cells}/uL (ref 15–500)
Eosinophils Relative: 2.3 %
HCT: 36.3 % (ref 35.0–45.0)
Hemoglobin: 11.7 g/dL (ref 11.7–15.5)
MCH: 28.5 pg (ref 27.0–33.0)
MCHC: 32.2 g/dL (ref 32.0–36.0)
MCV: 88.3 fL (ref 80.0–100.0)
MPV: 10.7 fL (ref 7.5–12.5)
Monocytes Relative: 6.8 %
Neutro Abs: 3597 {cells}/uL (ref 1500–7800)
Neutrophils Relative %: 52.9 %
Platelets: 258 10*3/uL (ref 140–400)
RBC: 4.11 10*6/uL (ref 3.80–5.10)
RDW: 17.3 % — ABNORMAL HIGH (ref 11.0–15.0)
Total Lymphocyte: 37.3 %
WBC: 6.8 10*3/uL (ref 3.8–10.8)

## 2023-11-08 LAB — IRON,TIBC AND FERRITIN PANEL
%SAT: 12 % — ABNORMAL LOW (ref 16–45)
Ferritin: 11 ng/mL — ABNORMAL LOW (ref 16–232)
Iron: 45 ug/dL (ref 40–190)
TIBC: 381 ug/dL (ref 250–450)

## 2023-11-08 MED ORDER — IRON (FERROUS SULFATE) 325 (65 FE) MG PO TABS: ORAL_TABLET | ORAL | 0 refills | Status: AC

## 2023-11-08 NOTE — Addendum Note (Signed)
 Addended by: Serafina Damme on: 11/08/2023 10:35 PM   Modules accepted: Orders

## 2023-11-10 ENCOUNTER — Other Ambulatory Visit

## 2023-11-10 ENCOUNTER — Other Ambulatory Visit (INDEPENDENT_AMBULATORY_CARE_PROVIDER_SITE_OTHER)

## 2023-11-10 DIAGNOSIS — D649 Anemia, unspecified: Secondary | ICD-10-CM

## 2023-11-14 LAB — FECAL OCCULT BLOOD, IMMUNOCHEMICAL: Fecal Occult Bld: NEGATIVE

## 2023-11-28 ENCOUNTER — Ambulatory Visit: Admitting: Medical

## 2023-11-28 ENCOUNTER — Ambulatory Visit (INDEPENDENT_AMBULATORY_CARE_PROVIDER_SITE_OTHER): Admitting: Medical

## 2023-11-28 VITALS — BP 110/68 | HR 79 | Temp 98.2°F | Resp 16 | Ht 62.0 in | Wt 177.0 lb

## 2023-11-28 DIAGNOSIS — E538 Deficiency of other specified B group vitamins: Secondary | ICD-10-CM | POA: Diagnosis not present

## 2023-11-28 DIAGNOSIS — Z862 Personal history of diseases of the blood and blood-forming organs and certain disorders involving the immune mechanism: Secondary | ICD-10-CM | POA: Diagnosis not present

## 2023-11-28 DIAGNOSIS — R790 Abnormal level of blood mineral: Secondary | ICD-10-CM | POA: Diagnosis not present

## 2023-11-28 LAB — IRON,TIBC AND FERRITIN PANEL
%SAT: 10 % — ABNORMAL LOW (ref 16–45)
Ferritin: 19 ng/mL (ref 16–232)
Iron: 36 ug/dL — ABNORMAL LOW (ref 40–190)
TIBC: 357 ug/dL (ref 250–450)

## 2023-11-28 NOTE — Patient Instructions (Addendum)
 Low ferritin with borderline low iron . Stable hb/hct. Low ferritin levels. Hemoglobin normal, no gastrointestinal bleeding. Improved energy with iron  supplementation, mild constipation noted. - Continue iron  sulfate 325 mg TID. - Order iron  panel to assess current iron  levels. Repeat cbc  Vitamin B12 Deficiency Vitamin B12 deficiency with previous injections. Recent high B12 level likely due to timing of blood draw. No oral supplements recently. Plan to reassess levels without injection influence. - Order vitamin B12 level without recent injection. - Communicate results and recommendations within 2-3 days. - Consider oral B12 supplementation based on results.  Follow up date to be determined after lab review

## 2023-11-28 NOTE — Progress Notes (Signed)
   Subjective:    Patient ID: Tiffany Morse, female    DOB: [birth year], 43 y.o.   MRN: [id number]  HPI Tiffany Morse is a 43 year old female with iron  deficiency anemia and B12 deficiency who presents for follow-up on her anemia management.  Her B12 level was over 2000 after receiving an injection on the same day as the blood test. She has not taken oral B12 supplements in the last three weeks.  She is currently taking iron  sulfate 325 mg, one tablet three times a day, a regimen she started three weeks ago. Her ferritin level was previously low, but her hemoglobin is normal. She has experienced increased energy since starting the iron  supplements. No significant increase in constipation, stating 'I always am like that.'  Her menstrual cycles are normal, occurring monthly with two days of heavier flow followed by lighter days. No blood in her stool, as recent tests were negative.   Review of Systems See hpi    Objective:   Physical Exam General- No acute distress. Pleasant patient. Neck-  no jvd Lungs- Clear, even and unlabored. Heart- regular rate and rhythm. Neurologic- CNII- XII grossly intact.        Assessment & Plan:   Patient Instructions  Low ferritin with borderline low iron . Stable hb/hct. Low ferritin levels. Hemoglobin normal, no gastrointestinal bleeding. Improved energy with iron  supplementation, mild constipation noted. - Continue iron  sulfate 325 mg TID. - Order iron  panel to assess current iron  levels. Repeat cbc  Vitamin B12 Deficiency Vitamin B12 deficiency with previous injections. Recent high B12 level likely due to timing of blood draw. No oral supplements recently. Plan to reassess levels without injection influence. - Order vitamin B12 level without recent injection. - Communicate results and recommendations within 2-3 days. - Consider oral B12 supplementation based on results.  Follow up date to be determined after lab review   Dallas Maxwell, PA-C

## 2023-11-29 ENCOUNTER — Ambulatory Visit: Payer: Self-pay | Admitting: Medical

## 2023-11-29 LAB — CBC WITH DIFFERENTIAL/PLATELET
Basophils Absolute: 0 K/uL (ref 0.0–0.1)
Basophils Relative: 0.7 % (ref 0.0–3.0)
Eosinophils Absolute: 0.2 K/uL (ref 0.0–0.7)
Eosinophils Relative: 2.8 % (ref 0.0–5.0)
HCT: 38.1 % (ref 36.0–46.0)
Hemoglobin: 12.9 g/dL (ref 12.0–15.0)
Lymphocytes Relative: 35.1 % (ref 12.0–46.0)
Lymphs Abs: 2.3 K/uL (ref 0.7–4.0)
MCHC: 33.8 g/dL (ref 30.0–36.0)
MCV: 86.4 fl (ref 78.0–100.0)
Monocytes Absolute: 0.4 K/uL (ref 0.1–1.0)
Monocytes Relative: 5.8 % (ref 3.0–12.0)
Neutro Abs: 3.6 K/uL (ref 1.4–7.7)
Neutrophils Relative %: 55.6 % (ref 43.0–77.0)
Platelets: 251 K/uL (ref 150.0–400.0)
RBC: 4.41 Mil/uL (ref 3.87–5.11)
RDW: 17.2 % — ABNORMAL HIGH (ref 11.5–15.5)
WBC: 6.5 K/uL (ref 4.0–10.5)

## 2023-11-29 LAB — VITAMIN B12: Vitamin B-12: 387 pg/mL (ref 211–911)

## 2023-12-03 DIAGNOSIS — Z419 Encounter for procedure for purposes other than remedying health state, unspecified: Secondary | ICD-10-CM | POA: Diagnosis not present

## 2024-01-03 DIAGNOSIS — Z419 Encounter for procedure for purposes other than remedying health state, unspecified: Secondary | ICD-10-CM | POA: Diagnosis not present

## 2024-02-03 DIAGNOSIS — Z419 Encounter for procedure for purposes other than remedying health state, unspecified: Secondary | ICD-10-CM | POA: Diagnosis not present
